# Patient Record
Sex: Male | Born: 1980 | Race: White | Hispanic: No | Marital: Married | Smoking: Never smoker
Health system: Southern US, Community
[De-identification: ages and names within clinical notes are randomized; demographics above are authoritative.]

## PROBLEM LIST (undated history)

## (undated) DIAGNOSIS — I712 Thoracic aortic aneurysm, without rupture, unspecified: Secondary | ICD-10-CM

## (undated) DIAGNOSIS — M109 Gout, unspecified: Secondary | ICD-10-CM

## (undated) DIAGNOSIS — R011 Cardiac murmur, unspecified: Secondary | ICD-10-CM

## (undated) DIAGNOSIS — Q2112 Patent foramen ovale: Secondary | ICD-10-CM

## (undated) DIAGNOSIS — I639 Cerebral infarction, unspecified: Secondary | ICD-10-CM

## (undated) HISTORY — DX: Cerebral infarction, unspecified: I63.9

## (undated) HISTORY — DX: Cardiac murmur, unspecified: R01.1

## (undated) HISTORY — DX: Patent foramen ovale: Q21.12

## (undated) HISTORY — DX: Gout, unspecified: M10.9

## (undated) HISTORY — DX: Thoracic aortic aneurysm, without rupture, unspecified: I71.20

---

## 2009-11-19 ENCOUNTER — Ambulatory Visit (HOSPITAL_COMMUNITY): Admission: RE | Admit: 2009-11-19 | Discharge: 2009-11-19 | Payer: Self-pay | Admitting: Orthopedic Surgery

## 2018-10-26 ENCOUNTER — Telehealth: Payer: Self-pay

## 2018-10-26 NOTE — Telephone Encounter (Signed)
Sent referral to scheduling and filed notes 

## 2018-10-31 NOTE — Progress Notes (Signed)
Chief Complaint  Patient presents with  . New Patient (Initial Visit)    murmur   History of Present Illness: 37 yo male with history of a cardiac murmur and possible history of bicuspid aortic valve as well as gout who is here today as a new consult, referred by Syble Creek, NP for the evaluation of his murmur. He was told as a child that he had a heart murmur. His mother recalls someone saying he may have a bicuspid aortic valve. He had testing as a kid but has not seen cardiology in the last 25 years. He feels great. No chest pain, dyspnea, near syncope, syncope or lower extremity edema. He is very active. He and his wife had their first child 6 weeks ago. He has rare palpitations that last for a few seconds.   Primary Care Physician: Pc, [facility] Syble Creek, NP)  Past Medical History:  Diagnosis Date  . Cardiac murmur, unspecified   . Gout    Past Surgical History:  No surgeries  Current Outpatient Medications  Medication Sig Dispense Refill  . allopurinol (ZYLOPRIM) 300 MG tablet Take 300 mg by mouth daily.    [name] azithromycin (ZITHROMAX) 250 MG tablet Take 2 tablets today and 1 tablet daily for 4 more days     No current facility-administered medications for this visit.     Allergies  Allergen Reactions  . Penicillins     Social History   Socioeconomic History  . Marital status: Married    Spouse name: Not on file  . Number of children: 1  . Years of education: Not on file  . Highest education level: Not on file  Occupational History  . Occupation: manages a Chief Technology Officer  Social Needs  . Financial resource strain: Not on file  . Food insecurity:    Worry: Not on file    Inability: Not on file  . Transportation needs:    Medical: Not on file    Non-medical: Not on file  Tobacco Use  . Smoking status: Never Smoker  Substance and Sexual Activity  . Alcohol use: Yes    Comment: social  . Drug use: Never  . Sexual activity: Not  on file  Lifestyle  . Physical activity:    Days per week: Not on file    Minutes per session: Not on file  . Stress: Not on file  Relationships  . Social connections:    Talks on phone: Not on file    Gets together: Not on file    Attends religious service: Not on file    Active member of club or organization: Not on file    Attends meetings of clubs or organizations: Not on file    Relationship status: Not on file  . Intimate partner violence:    Fear of current or ex partner: Not on file    Emotionally abused: Not on file    Physically abused: Not on file    Forced sexual activity: Not on file  Other Topics Concern  . Not on file  Social History Narrative  . Not on file    Family History  Problem Relation Age of Onset  . Heart murmur Mother   . Benign prostatic hyperplasia Father   . Other Father        knee replacement  . CAD Maternal Grandfather   . Atrial fibrillation Maternal Grandfather     Review of Systems:  As stated in the HPI  and otherwise negative.   BP 124/72   Pulse (!) 52   Ht 6\' 1"  (1.854 m)   Wt 263 lb 6.4 oz (119.5 kg)   SpO2 99%   BMI 34.75 kg/m   Physical Examination: General: Well developed, well nourished, NAD  HEENT: OP clear, mucus membranes moist  SKIN: warm, dry. No rashes. Neuro: No focal deficits  Musculoskeletal: Muscle strength 5/5 all ext  Psychiatric: Mood and affect normal  Neck: No JVD, no carotid bruits, no thyromegaly, no lymphadenopathy.  Lungs:Clear bilaterally, no wheezes, rhonci, crackles Cardiovascular: Regular rate and rhythm. Very soft systolic murmur.  Abdomen:Soft. Bowel sounds present. Non-tender.  Extremities: No lower extremity edema. Pulses are 2 + in the bilateral DP/PT.  EKG:  EKG is ordered today. The ekg ordered today demonstrates Sinus brady, rate 51 bpm.   Recent Labs: No results found for requested labs within last 8760 hours.   Lipid Panel No results found for: CHOL, TRIG, HDL, CHOLHDL, VLDL,  LDLCALC, LDLDIRECT   Wt Readings from Last 3 Encounters:  11/01/18 263 lb 6.4 oz (119.5 kg)     Other studies Reviewed: Additional studies/ records that were reviewed today include:  Review of the above records demonstrates:    Assessment and Plan:   1. Cardiac murmur: He has a very soft systolic murmur. He has been told in the past that he may have a bicuspid aortic valve. He has no dyspnea or chest pain. Will arrange echo to assess his valves.   Current medicines are reviewed at length with the patient today.  The patient does not have concerns regarding medicines.  The following changes have been made:  no change  Labs/ tests ordered today include:   Orders Placed This Encounter  Procedures  . EKG 12-Lead  . ECHOCARDIOGRAM COMPLETE     Disposition:   FU with me in 12 months   Signed, Verne Carrowhristopher Yailin Biederman, MD 11/01/2018 12:43 PM    [facility] [address], [place], [place]  [id number] Phone: [phone]; Fax: [phone]

## 2018-11-01 ENCOUNTER — Encounter (INDEPENDENT_AMBULATORY_CARE_PROVIDER_SITE_OTHER): Payer: Self-pay

## 2018-11-01 ENCOUNTER — Encounter: Payer: Self-pay | Admitting: Cardiovascular Disease

## 2018-11-01 ENCOUNTER — Ambulatory Visit (INDEPENDENT_AMBULATORY_CARE_PROVIDER_SITE_OTHER): Payer: 59 | Admitting: Cardiovascular Disease

## 2018-11-01 VITALS — BP 124/72 | HR 52 | Ht 73.0 in | Wt 263.4 lb

## 2018-11-01 DIAGNOSIS — R011 Cardiac murmur, unspecified: Secondary | ICD-10-CM

## 2018-11-01 NOTE — Patient Instructions (Signed)
Medication Instructions:  Your physician recommends that you continue on your current medications as directed. Please refer to the Current Medication list given to you today.  If you need a refill on your cardiac medications before your next appointment, please call your pharmacy.   Lab work: none If you have labs (blood work) drawn today and your tests are completely normal, you will receive your results only by: . MyChart Message (if you have MyChart) OR . A paper copy in the mail If you have any lab test that is abnormal or we need to change your treatment, we will call you to review the results.  Testing/Procedures: Your physician has requested that you have an echocardiogram. Echocardiography is a painless test that uses sound waves to create images of your heart. It provides your doctor with information about the size and shape of your heart and how well your heart's chambers and valves are working. This procedure takes approximately one hour. There are no restrictions for this procedure.    Follow-Up: At [facility], you and your health needs are our priority.  As part of our continuing mission to provide you with exceptional heart care, we have created designated Provider Care Teams.  These Care Teams include your primary Cardiologist (physician) and Advanced Practice Providers (APPs -  Physician Assistants and Nurse Practitioners) who all work together to provide you with the care you need, when you need it. You will need a follow up appointment in 12 months.  Please call our office 2 months in advance to schedule this appointment.  You may see Dr. McAlhany  or one of the following Advanced Practice Providers on your designated Care Team:   Brittainy Simmons, PA-C Dayna Dunn, PA-C . Michele Lenze, PA-C  Any Other Special Instructions Will Be Listed Below (If Applicable).     

## 2018-11-13 ENCOUNTER — Other Ambulatory Visit (HOSPITAL_COMMUNITY): Payer: 59

## 2018-11-16 ENCOUNTER — Ambulatory Visit (HOSPITAL_COMMUNITY): Payer: 59 | Attending: Cardiovascular Disease

## 2018-11-16 ENCOUNTER — Other Ambulatory Visit: Payer: Self-pay

## 2018-11-16 ENCOUNTER — Telehealth: Payer: Self-pay | Admitting: *Deleted

## 2018-11-16 DIAGNOSIS — R011 Cardiac murmur, unspecified: Secondary | ICD-10-CM | POA: Diagnosis present

## 2018-11-16 DIAGNOSIS — I77810 Thoracic aortic ectasia: Secondary | ICD-10-CM

## 2018-11-16 NOTE — Telephone Encounter (Signed)
-----   Message from [name], MD sent at 11/16/2018  1:43 PM EST ----- No valvular disease noted to explain a murmur. No obvious holes in his heart. His heart is strong. The aorta is mildly dilated. He will need a chest CTA to better evaluate his aortic root/aorta. Adam Mayer

## 2018-11-16 NOTE — Telephone Encounter (Signed)
Pt notified. He would like to proceed with chest CTA.

## 2018-12-12 ENCOUNTER — Ambulatory Visit (INDEPENDENT_AMBULATORY_CARE_PROVIDER_SITE_OTHER)
Admission: RE | Admit: 2018-12-12 | Discharge: 2018-12-12 | Disposition: A | Discharge status: Home / Self Care | Payer: 59 | Source: Ambulatory Visit | Attending: Cardiovascular Disease | Admitting: Cardiovascular Disease

## 2018-12-12 DIAGNOSIS — I77810 Thoracic aortic ectasia: Secondary | ICD-10-CM

## 2018-12-12 MED ORDER — IOPAMIDOL (ISOVUE-370) INJECTION 76%
100.0000 mL | Freq: Once | INTRAVENOUS | Status: AC | PRN
  Administered 2018-12-12: 100 mL via INTRAVENOUS

## 2018-12-14 ENCOUNTER — Telehealth: Payer: Self-pay | Admitting: *Deleted

## 2018-12-14 DIAGNOSIS — I712 Thoracic aortic aneurysm, without rupture, unspecified: Secondary | ICD-10-CM

## 2018-12-14 NOTE — Telephone Encounter (Signed)
I spoke with pt and reviewed results with him 

## 2018-12-14 NOTE — Telephone Encounter (Signed)
-----   Message from Kathleene Hazel, MD sent at 12/13/2018 11:21 AM EST ----- Very subtle dilation of the ascending aorta. Something we can follow. Will need repeat chest CTA in one year. cdm

## 2019-01-02 ENCOUNTER — Telehealth: Payer: Self-pay | Admitting: Cardiovascular Disease

## 2019-01-02 NOTE — Telephone Encounter (Signed)
  1. What dental office are you calling from? Dr Caryl Never  2. What is your office phone number? [phone]  3. What is your fax number? [phone]  4. What type of procedure is the patient having performed? Routine cleaning  5. What date is procedure scheduled or is the patient there now? TBD (if the patient is at the dentist's office question goes to their cardiologist if he/she is in the office.  If not, question should go to the DOD).   6. What is your question (ex. Antibiotics prior to procedure, holding medication-we need to know how long dentist wants pt to hold med)? Dentist would like to know if patient would need an antibiotic prior to cleaning

## 2019-01-02 NOTE — Telephone Encounter (Signed)
   Primary Cardiologist: Verne Carrow, MD  Chart reviewed as part of pre-operative protocol coverage. Simple dental extractions and routine cleanings are considered low risk procedures per guidelines and generally do not require any specific cardiac clearance. It is also generally accepted that for simple extractions and dental cleanings, there is no need to interrupt blood thinner therapy.   SBE prophylaxis is not required for the patient.  I will route this recommendation to the requesting party via Epic fax function and remove from pre-op pool.  Please call with questions.  Beatriz Stallion, PA-C 01/02/2019, 4:55 PM

## 2019-09-21 ENCOUNTER — Other Ambulatory Visit: Payer: Self-pay

## 2019-09-21 ENCOUNTER — Ambulatory Visit (INDEPENDENT_AMBULATORY_CARE_PROVIDER_SITE_OTHER): Payer: 59 | Admitting: Cardiology

## 2019-09-21 ENCOUNTER — Encounter: Payer: Self-pay | Admitting: Cardiology

## 2019-09-21 VITALS — BP 140/80 | HR 59 | Ht 73.0 in | Wt 265.0 lb

## 2019-09-21 DIAGNOSIS — Q211 Atrial septal defect: Secondary | ICD-10-CM

## 2019-09-21 DIAGNOSIS — I639 Cerebral infarction, unspecified: Secondary | ICD-10-CM

## 2019-09-21 DIAGNOSIS — Q2112 Patent foramen ovale: Secondary | ICD-10-CM

## 2019-09-21 DIAGNOSIS — R011 Cardiac murmur, unspecified: Secondary | ICD-10-CM

## 2019-09-21 DIAGNOSIS — I712 Thoracic aortic aneurysm, without rupture, unspecified: Secondary | ICD-10-CM

## 2019-09-21 MED ORDER — ATORVASTATIN CALCIUM 40 MG PO TABS: 40.0000 mg | ORAL_TABLET | Freq: Every day | ORAL | 3 refills | Status: DC

## 2019-09-21 NOTE — H&P (View-Only) (Signed)
Cardiology Office Note:    Date:  09/21/2019   ID:  Adam Mayer, DOB 04/15/1981, MRN [id number]  PCP:  Creig HinesPc, [facility]  Cardiologist:  Verne Carrowhristopher McAlhany, MD  Referring MD: Don BroachPc, [facility]   Chief Complaint  Patient presents with  . Hospitalization Follow-up    stroke, possible PFO    History of Present Illness:    Adam Mayer is a 38 y.o. male with a past medical history significant for cardiac murmur and possible history of bicuspid aortic valve as well as gout.  The patient was noted to have had a heart murmur since childhood.  He underwent some testing as a child but did not have cardiology follow-up until 10/2018 when he was seen by Dr. Clifton JamesMcalhany.  An echocardiogram done on 11/16/2018 showed normal LV systolic function and no valvular disease to explain his cardiac murmur.  CT angiogram was done on 12/12/2018 showing mildly aneurysmal ascending aorta up to 4.0 cm.  Annual imaging with CTA or MRA is recommended.  Mr. Adam Mayer is here today for hospital follow-up.  [facility] with complaints of left arm and leg numbness.  A code stroke was called.  CT scan of the head did not show any acute changes however MRI of the brain showed small acute infarction in the right corona radiata and small old infarctions in the left caudate nucleus, old lacunar infarction in the left putamen, small old infarction in his left cerebellum.  An echocardiogram showed normal EF 55-60% and did show positive bubble study indicating possible small PFO.  Aortic root was mildly dilated around 4 cm.  Dopplers of the lower extremities were negative for DVT.  Patient was started on aspirin and statin.  Inpatient cardiology recommended cardiac event monitor to rule out any episodes of A. fib and recommended that patient would also benefit from TEE to further assess PFO.  5 Leiden was checked and was negative.  Sed rate was normal.  Antithrombin III  was normal.  Factor II activity was normal.  Homocysteine level was in normal range.  Vitamin B12 was in normal range.  A1c was 5.0.  Lipids: TC 147, triglycerides 136, HDL 41, LDL 79 EKG with sinus bradycardia, 57 bpm  Mr. Adam Mayer is here today alone for hospital follow up. He says that he has a family history of afib in some cousins, not in his parents. His grandfather had CABG and pacemaker, possibly afib. He is feeling well. He was initially tired after the hospital stay and felt like his left side "had run 10 miles", but has felt well yesterday and today. No lightheadedness, dizziness, or loss of balance. He denies palpitations recently. He has had some quick flutters in the past that cause his to catch his breath, nothing sustained. He denies orthopnea, PND or edema.   He admits to snoring sometimes worse than others. He says it it better since he lost about 40 pounds.    Cardiac studies   Echocardiogram 09/12/2019 Left Ventricle: Systolic function is normal with an ejection fraction  of 55-65%. . Left Ventricle: Wall motion is within normal limits. . Left Atrium: Left atrium cavity size is normal. POSITIVE BUBBLE STUDY  indicating possible small PFO. Adam Mayer. Aorta: The aortic root is mildly dilated. Aortic root measures around  4.0 cm.   MRI of the head 09/12/2019 IMPRESSION:   1. Small (approximately 13 mm craniocaudal) acute infarction in the right perirolandic corona radiata. 2.  Old small infarction in the anterior body of the left caudate nucleus. 3. Old small lacunar infarction in the left putamen. 4. Very small old infarctions in the left cerebellar hemisphere. 5. The left maxillary sinus is completely filled by a combination of mucosal thickening and secretions.  MRA of the head/neck 09/12/2019 FINDINGS:   Time-of-flight MRA: TOF MRA demonstrates antegrade flow within the carotid and vertebral arteries bilaterally.  Aortic arch: No evidence of aneurysm. No significant  stenosis of the supra-aortic artery origins.  Right carotid system: No significant stenosis.  Left carotid system: No significant stenosis.  Vertebrobasilar system: No significant stenosis. The left vertebral artery is dominant.  No evidence of cervical arterial dissection. IMPRESSION: Normal MRA of the neck without and with contrast.  Cardiac CTA 12/12/2018 IMPRESSION: 1. Aortic aneurysm NOS (ICD10-I71.9). Ascending thoracic aorta measures up to 4.0 cm. Recommend annual imaging followup by CTA or MRA. This recommendation follows 2010 ACCF/AHA/AATS/ACR/ASA/SCA/SCAI/SIR/STS/SVM Guidelines for the Diagnosis and Management of Patients with Thoracic Aortic Disease. Circulation. 2010; 121: Z610-R604 2. Tiny hazy nodular densities in the medial right lower lobe are nonspecific but favor an infectious or postinflammatory process in this area. Consider first follow-up of the aortic aneurysm to be a CTA to evaluate these small densities in the right lower lobe. Otherwise, favor cross-sectional imaging follow-up with MRI due to patient's age.   Echocardiogram 11/16/2018 Study Conclusions  - Left ventricle: The cavity size was normal. Wall thickness was   normal. Systolic function was normal. The estimated ejection   fraction was in the range of 60% to 65%. Wall motion was normal;   there were no regional wall motion abnormalities. Features are   consistent with a pseudonormal left ventricular filling pattern,   with concomitant abnormal relaxation and increased filling   pressure (grade 2 diastolic dysfunction).  Past Medical History:  Diagnosis Date  . Cardiac murmur, unspecified   . Gout     No past surgical history on file.  Current Medications: Current Meds  Medication Sig  . allopurinol (ZYLOPRIM) 300 MG tablet Take 300 mg by mouth daily.  Adam Mayer aspirin 325 MG tablet Take 325 mg by mouth daily.  Adam Mayer atorvastatin (LIPITOR) 40 MG tablet Take 1 tablet (40 mg total) by mouth at  bedtime.  . [DISCONTINUED] atorvastatin (LIPITOR) 40 MG tablet Take 40 mg by mouth at bedtime.     Allergies:   Penicillins   Social History   Socioeconomic History  . Marital status: Married    Spouse name: Not on file  . Number of children: 1  . Years of education: Not on file  . Highest education level: Not on file  Occupational History  . Occupation: manages a Chief Technology Officer  Social Needs  . Financial resource strain: Not on file  . Food insecurity    Worry: Not on file    Inability: Not on file  . Transportation needs    Medical: Not on file    Non-medical: Not on file  Tobacco Use  . Smoking status: Never Smoker  . Smokeless tobacco: Never Used  Substance and Sexual Activity  . Alcohol use: Yes    Comment: social  . Drug use: Never  . Sexual activity: Not on file  Lifestyle  . Physical activity    Days per week: Not on file    Minutes per session: Not on file  . Stress: Not on file  Relationships  . Social connections    Talks on phone: Not on file  Gets together: Not on file    Attends religious service: Not on file    Active member of club or organization: Not on file    Attends meetings of clubs or organizations: Not on file    Relationship status: Not on file  Other Topics Concern  . Not on file  Social History Narrative  . Not on file     Family History: The patient's family history includes Atrial fibrillation in his maternal grandfather; Benign prostatic hyperplasia in his father; CAD in his maternal grandfather; Heart murmur in his mother; Other in his father. ROS:   Please see the history of present illness.     All other systems reviewed and are negative.   EKG:  EKG is ordered today.  The ekg ordered today demonstrates sinus bradycardia, 59 bpm, PAC, QTC 443  Recent Labs: No results found for requested labs within last 8760 hours.   Recent Lipid Panel No results found for: CHOL, TRIG, HDL, CHOLHDL, VLDL, LDLCALC, LDLDIRECT   Physical Exam:    VS:  BP 140/80   Pulse (!) 59   Ht  (1.854 m)   Wt 265 lb (120.2 kg)   SpO2 98%   BMI 34.96 kg/m     Wt Readings from Last 6 Encounters:  09/21/19 265 lb (120.2 kg)  11/01/18 263 lb 6.4 oz (119.5 kg)     Physical Exam  Constitutional: He is oriented to person, place, and time. He appears well-developed and well-nourished. No distress.  HENT:  Head: Normocephalic and atraumatic.  Neck: Normal range of motion. Neck supple. No JVD present.  Cardiovascular: Normal rate, regular rhythm and intact distal pulses.  Murmur heard. 1/6 faint murmur at right upper sternal border  Pulmonary/Chest: Effort normal and breath sounds normal. No respiratory distress. He has no wheezes. He has no rales.  Abdominal: Soft. Bowel sounds are normal.  Musculoskeletal: Normal range of motion.        General: No edema.  Neurological: He is alert and oriented to person, place, and time.  Skin: Skin is warm and dry.  Psychiatric: He has a normal mood and affect. His behavior is normal. Judgment and thought content normal.  Vitals reviewed.   ASSESSMENT:    1. Cerebrovascular accident (CVA), unspecified mechanism (HCC)   2. PFO (patent foramen ovale)   3. Thoracic aortic aneurysm without rupture (HCC)   4. Cardiac murmur   5. 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  MRI of the brain showed small acute infarction in the right corona radiata and small old infarctions in the left caudate nucleus, old lacunar infarction in the left putamen, small old infarction in his left cerebellum.   -Patient started on aspirin 325 mg and statin, Lipitor 40 mg daily. -MRA showed no significant stenosis of the carotid arteries -Seen by cardiology at [facility] and recommended event monitor to assess for occult atrial fibrillation and possible TEE to evaluate for PFO.  Bubble  study in the hospital indicated possible small PFO.  I will order both a 30-day event monitor and TEE.  Once TEE resulted, I will review with Dr. Excell Seltzer. -Patient to follow-up with neurology.  I will place a referral to a neurologist in [place] at the patient's request.  Possible PFO -Echo on 09/12/2019 during hospitalization for stroke showed positive bubble study indicating possible small PFO.  As above, will arrange for TEE for further evaluation and possible referral to Dr. Burt Knack if there is a need for repair.  Dilated ascending aorta -Subtle dilatation of the ascending aorta on CTA in 12/2018, up to 4.0 cm. -Recommendation for annual CTA.  Cardiac murmur -Patient thought he was told by his mother that he may have a bicuspid aortic valve however echocardiogram in 12/2017 showed normal aortic valve structure -Recent echocardiogram on 09/12/2019 at [place] showed normal EF 55-65%.  Medication Adjustments/Labs and Tests Ordered: Current medicines are reviewed at length with the patient today.  Concerns regarding medicines are outlined above. Labs and tests ordered and medication changes are outlined in the patient instructions below:  Patient Instructions  Medication Instructions:  Your physician recommends that you continue on your current medications as directed. Please refer to the Current Medication list given to you today.  *If you need a refill on your cardiac medications before your next appointment, please call your pharmacy*  Lab Work: TODAY: BMET & CBC   If you have labs (blood work) drawn today and your tests are completely normal, you will receive your results only by: Adam Mayer MyChart Message (if you have MyChart) OR . A paper copy in the mail If you have any lab test that is abnormal or we need to change your treatment, we will call you to review the results.  Testing/Procedures: Your physician has requested that you have a TEE.   Your Pre-procedure COVID-19 Testing  will be done on 09/26/2019 at [place], After your swab you will be given a mask to wear and instructed to go home and quarantine you are not allowed to have visitors until after your procedure. If you test positive you will be notified and your procedure will be cancelled.     A referral has been placed for you to see [facility]. Someone will contact you with an appointment    Your physician has recommended that you wear an event monitor. Event monitors are medical devices that record the heart's electrical activity. Doctors most often [place] these monitors to diagnose arrhythmias. Arrhythmias are problems with the speed or rhythm of the heartbeat. The monitor is a small, portable device. You can wear one while you do your normal daily activities. This is usually used to diagnose what is causing palpitations/syncope (passing out).     Follow-Up: At [facility], you and your health needs are our priority.  As part of our continuing mission to provide you with exceptional heart care, we have created designated Provider Care Teams.  These Care Teams include your primary Cardiologist (physician) and Advanced Practice Providers (APPs -  Physician Assistants and Nurse Practitioners) who all work together to provide you with the care you need, when you need it.  Your next appointment: We will call you with a follow up appointment once we get the results for your TEE.   Other Instructions  You are scheduled for a TEE with Dr. Harrell Gave.  Please arrive at the [facility] (Main Entrance A) at [facility]: [address] [place], [place] [address] at 7:30 am am (1 hour prior to procedure unless lab work is needed; if lab work is needed arrive 1.5 hours ahead)  DIET: Nothing to eat or drink after midnight except a sip of water with medications (see medication instructions below)     You must have a responsible person to drive you home and stay in the waiting  area  during your procedure. Failure to do so could result in cancellation.  Bring your insurance cards.  *Special Note: Every effort is made to have your procedure done on time. Occasionally there are emergencies that occur at the hospital that may cause delays. Please be patient if a delay does occur.      Signed, Berton Bon, NP  09/21/2019 6:21 PM    [facility]

## 2019-09-21 NOTE — Progress Notes (Signed)
Cardiology Office Note:    Date:  09/21/2019   ID:  Adam Mayer, DOB 04/15/1981, MRN [id number]  PCP:  [facility]ig HinesPc, [facility]  Cardiologist:  Verne Carrowhristopher McAlhany, MD  Referring MD: Don BroachPc, [facility]   Chief Complaint  Patient presents with  . Hospitalization Follow-up    stroke, possible PFO    History of Present Illness:    Adam Mayer is a 38 y.o. male with a past medical history significant for cardiac murmur and possible history of bicuspid aortic valve as well as gout.  The patient was noted to have had a heart murmur since childhood.  He underwent some testing as a child but did not have cardiology follow-up until 10/2018 when he was seen by Dr. Clifton JamesMcalhany.  An echocardiogram done on 11/16/2018 showed normal LV systolic function and no valvular disease to explain his cardiac murmur.  CT angiogram was done on 12/12/2018 showing mildly aneurysmal ascending aorta up to 4.0 cm.  Annual imaging with CTA or MRA is recommended.  Mr. Adam Mayer is here today for hospital follow-up.  [facility] with complaints of left arm and leg numbness.  A code stroke was called.  CT scan of the head did not show any acute changes however MRI of the brain showed small acute infarction in the right corona radiata and small old infarctions in the left caudate nucleus, old lacunar infarction in the left putamen, small old infarction in his left cerebellum.  An echocardiogram showed normal EF 55-60% and did show positive bubble study indicating possible small PFO.  Aortic root was mildly dilated around 4 cm.  Dopplers of the lower extremities were negative for DVT.  Patient was started on aspirin and statin.  Inpatient cardiology recommended cardiac event monitor to rule out any episodes of A. fib and recommended that patient would also benefit from TEE to further assess PFO.  5 Leiden was checked and was negative.  Sed rate was normal.  Antithrombin III  was normal.  Factor II activity was normal.  Homocysteine level was in normal range.  Vitamin B12 was in normal range.  A1c was 5.0.  Lipids: TC 147, triglycerides 136, HDL 41, LDL 79 EKG with sinus bradycardia, 57 bpm  Mr. Adam Mayer is here today alone for hospital follow up. He says that he has a family history of afib in some cousins, not in his parents. His grandfather had CABG and pacemaker, possibly afib. He is feeling well. He was initially tired after the hospital stay and felt like his left side "had run 10 miles", but has felt well yesterday and today. No lightheadedness, dizziness, or loss of balance. He denies palpitations recently. He has had some quick flutters in the past that cause his to catch his breath, nothing sustained. He denies orthopnea, PND or edema.   He admits to snoring sometimes worse than others. He says it it better since he lost about 40 pounds.    Cardiac studies   Echocardiogram 09/12/2019 Left Ventricle: Systolic function is normal with an ejection fraction  of 55-65%. . Left Ventricle: Wall motion is within normal limits. . Left Atrium: Left atrium cavity size is normal. POSITIVE BUBBLE STUDY  indicating possible small PFO. Adam Mayer. Aorta: The aortic root is mildly dilated. Aortic root measures around  4.0 cm.   MRI of the head 09/12/2019 IMPRESSION:   1. Small (approximately 13 mm craniocaudal) acute infarction in the right perirolandic corona radiata. 2.  Old small infarction in the anterior body of the left caudate nucleus. 3. Old small lacunar infarction in the left putamen. 4. Very small old infarctions in the left cerebellar hemisphere. 5. The left maxillary sinus is completely filled by a combination of mucosal thickening and secretions.  MRA of the head/neck 09/12/2019 FINDINGS:   Time-of-flight MRA: TOF MRA demonstrates antegrade flow within the carotid and vertebral arteries bilaterally.  Aortic arch: No evidence of aneurysm. No significant  stenosis of the supra-aortic artery origins.  Right carotid system: No significant stenosis.  Left carotid system: No significant stenosis.  Vertebrobasilar system: No significant stenosis. The left vertebral artery is dominant.  No evidence of cervical arterial dissection. IMPRESSION: Normal MRA of the neck without and with contrast.  Cardiac CTA 12/12/2018 IMPRESSION: 1. Aortic aneurysm NOS (ICD10-I71.9). Ascending thoracic aorta measures up to 4.0 cm. Recommend annual imaging followup by CTA or MRA. This recommendation follows 2010 ACCF/AHA/AATS/ACR/ASA/SCA/SCAI/SIR/STS/SVM Guidelines for the Diagnosis and Management of Patients with Thoracic Aortic Disease. Circulation. 2010; 121: Z610-R604 2. Tiny hazy nodular densities in the medial right lower lobe are nonspecific but favor an infectious or postinflammatory process in this area. Consider first follow-up of the aortic aneurysm to be a CTA to evaluate these small densities in the right lower lobe. Otherwise, favor cross-sectional imaging follow-up with MRI due to patient's age.   Echocardiogram 11/16/2018 Study Conclusions  - Left ventricle: The cavity size was normal. Wall thickness was   normal. Systolic function was normal. The estimated ejection   fraction was in the range of 60% to 65%. Wall motion was normal;   there were no regional wall motion abnormalities. Features are   consistent with a pseudonormal left ventricular filling pattern,   with concomitant abnormal relaxation and increased filling   pressure (grade 2 diastolic dysfunction).  Past Medical History:  Diagnosis Date  . Cardiac murmur, unspecified   . Gout     No past surgical history on file.  Current Medications: Current Meds  Medication Sig  . allopurinol (ZYLOPRIM) 300 MG tablet Take 300 mg by mouth daily.  Adam Mayer aspirin 325 MG tablet Take 325 mg by mouth daily.  Adam Mayer atorvastatin (LIPITOR) 40 MG tablet Take 1 tablet (40 mg total) by mouth at  bedtime.  . [DISCONTINUED] atorvastatin (LIPITOR) 40 MG tablet Take 40 mg by mouth at bedtime.     Allergies:   Penicillins   Social History   Socioeconomic History  . Marital status: Married    Spouse name: Not on file  . Number of children: 1  . Years of education: Not on file  . Highest education level: Not on file  Occupational History  . Occupation: manages a Chief Technology Officer  Social Needs  . Financial resource strain: Not on file  . Food insecurity    Worry: Not on file    Inability: Not on file  . Transportation needs    Medical: Not on file    Non-medical: Not on file  Tobacco Use  . Smoking status: Never Smoker  . Smokeless tobacco: Never Used  Substance and Sexual Activity  . Alcohol use: Yes    Comment: social  . Drug use: Never  . Sexual activity: Not on file  Lifestyle  . Physical activity    Days per week: Not on file    Minutes per session: Not on file  . Stress: Not on file  Relationships  . Social connections    Talks on phone: Not on file  Gets together: Not on file    Attends religious service: Not on file    Active member of club or organization: Not on file    Attends meetings of clubs or organizations: Not on file    Relationship status: Not on file  Other Topics Concern  . Not on file  Social History Narrative  . Not on file     Family History: The patient's family history includes Atrial fibrillation in his maternal grandfather; Benign prostatic hyperplasia in his father; CAD in his maternal grandfather; Heart murmur in his mother; Other in his father. ROS:   Please see the history of present illness.     All other systems reviewed and are negative.   EKG:  EKG is ordered today.  The ekg ordered today demonstrates sinus bradycardia, 59 bpm, PAC, QTC 443  Recent Labs: No results found for requested labs within last 8760 hours.   Recent Lipid Panel No results found for: CHOL, TRIG, HDL, CHOLHDL, VLDL, LDLCALC, LDLDIRECT   Physical Exam:    VS:  BP 140/80   Pulse (!) 59   Ht 6' 1" (1.854 m)   Wt 265 lb (120.2 kg)   SpO2 98%   BMI 34.96 kg/m     Wt Readings from Last 6 Encounters:  09/21/19 265 lb (120.2 kg)  11/01/18 263 lb 6.4 oz (119.5 kg)     Physical Exam  Constitutional: He is oriented to person, place, and time. He appears well-developed and well-nourished. No distress.  HENT:  Head: Normocephalic and atraumatic.  Neck: Normal range of motion. Neck supple. No JVD present.  Cardiovascular: Normal rate, regular rhythm and intact distal pulses.  Murmur heard. 1/6 faint murmur at right upper sternal border  Pulmonary/Chest: Effort normal and breath sounds normal. No respiratory distress. He has no wheezes. He has no rales.  Abdominal: Soft. Bowel sounds are normal.  Musculoskeletal: Normal range of motion.        General: No edema.  Neurological: He is alert and oriented to person, place, and time.  Skin: Skin is warm and dry.  Psychiatric: He has a normal mood and affect. His behavior is normal. Judgment and thought content normal.  Vitals reviewed.   ASSESSMENT:    1. Cerebrovascular accident (CVA), unspecified mechanism (HCC)   2. PFO (patent foramen ovale)   3. Thoracic aortic aneurysm without rupture (HCC)   4. Cardiac murmur   5. 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  MRI of the brain showed small acute infarction in the right corona radiata and small old infarctions in the left caudate nucleus, old lacunar infarction in the left putamen, small old infarction in his left cerebellum.   -Patient started on aspirin 325 mg and statin, Lipitor 40 mg daily. -MRA showed no significant stenosis of the carotid arteries -Seen by cardiology at [facility] and recommended event monitor to assess for occult atrial fibrillation and possible TEE to evaluate for PFO.  Bubble  study in the hospital indicated possible small PFO.  I will order both a 30-day event monitor and TEE.  Once TEE resulted, I will review with Dr. Cooper. -Patient to follow-up with neurology.  I will place a referral to a neurologist in [place] at the patient's request.  Possible PFO -Echo on 09/12/2019 during hospitalization for stroke showed positive bubble study indicating possible small PFO.    As above, will arrange for TEE for further evaluation and possible referral to Dr. Burt Knack if there is a need for repair.  Dilated ascending aorta -Subtle dilatation of the ascending aorta on CTA in 12/2018, up to 4.0 cm. -Recommendation for annual CTA.  Cardiac murmur -Patient thought he was told by his mother that he may have a bicuspid aortic valve however echocardiogram in 12/2017 showed normal aortic valve structure -Recent echocardiogram on 09/12/2019 at [place] showed normal EF 55-65%.  Medication Adjustments/Labs and Tests Ordered: Current medicines are reviewed at length with the patient today.  Concerns regarding medicines are outlined above. Labs and tests ordered and medication changes are outlined in the patient instructions below:  Patient Instructions  Medication Instructions:  Your physician recommends that you continue on your current medications as directed. Please refer to the Current Medication list given to you today.  *If you need a refill on your cardiac medications before your next appointment, please call your pharmacy*  Lab Work: TODAY: BMET & CBC   If you have labs (blood work) drawn today and your tests are completely normal, you will receive your results only by: Adam Mayer MyChart Message (if you have MyChart) OR . A paper copy in the mail If you have any lab test that is abnormal or we need to change your treatment, we will call you to review the results.  Testing/Procedures: Your physician has requested that you have a TEE.   Your Pre-procedure COVID-19 Testing  will be done on 09/26/2019 at [place], After your swab you will be given a mask to wear and instructed to go home and quarantine you are not allowed to have visitors until after your procedure. If you test positive you will be notified and your procedure will be cancelled.     A referral has been placed for you to see [facility]. Someone will contact you with an appointment    Your physician has recommended that you wear an event monitor. Event monitors are medical devices that record the heart's electrical activity. Doctors most often [place] these monitors to diagnose arrhythmias. Arrhythmias are problems with the speed or rhythm of the heartbeat. The monitor is a small, portable device. You can wear one while you do your normal daily activities. This is usually used to diagnose what is causing palpitations/syncope (passing out).     Follow-Up: At [facility], you and your health needs are our priority.  As part of our continuing mission to provide you with exceptional heart care, we have created designated Provider Care Teams.  These Care Teams include your primary Cardiologist (physician) and Advanced Practice Providers (APPs -  Physician Assistants and Nurse Practitioners) who all work together to provide you with the care you need, when you need it.  Your next appointment: We will call you with a follow up appointment once we get the results for your TEE.   Other Instructions  You are scheduled for a TEE with Dr. Harrell Gave.  Please arrive at the [facility] (Main Entrance A) at [facility]: [address] [place], [place] [address] at 7:30 am am (1 hour prior to procedure unless lab work is needed; if lab work is needed arrive 1.5 hours ahead)  DIET: Nothing to eat or drink after midnight except a sip of water with medications (see medication instructions below)     You must have a responsible person to drive you home and stay in the waiting  area  during your procedure. Failure to do so could result in cancellation.  Bring your insurance cards.  *Special Note: Every effort is made to have your procedure done on time. Occasionally there are emergencies that occur at the hospital that may cause delays. Please be patient if a delay does occur.      Signed, Berton Bon, NP  09/21/2019 6:21 PM    [facility]

## 2019-09-21 NOTE — Patient Instructions (Addendum)
Medication Instructions:  Your physician recommends that you continue on your current medications as directed. Please refer to the Current Medication list given to you today.  *If you need a refill on your cardiac medications before your next appointment, please call your pharmacy*  Lab Work: TODAY: BMET & CBC   If you have labs (blood work) drawn today and your tests are completely normal, you will receive your results only by: [name] MyChart Message (if you have MyChart) OR . A paper copy in the mail If you have any lab test that is abnormal or we need to change your treatment, we will call you to review the results.  Testing/Procedures: Your physician has requested that you have a TEE.   Your Pre-procedure COVID-19 Testing will be done on 09/26/2019 at [place], After your swab you will be given a mask to wear and instructed to go home and quarantine you are not allowed to have visitors until after your procedure. If you test positive you will be notified and your procedure will be cancelled.     A referral has been placed for you to see [facility]. Someone will contact you with an appointment    Your physician has recommended that you wear an event monitor. Event monitors are medical devices that record the heart's electrical activity. Doctors most often [place] these monitors to diagnose arrhythmias. Arrhythmias are problems with the speed or rhythm of the heartbeat. The monitor is a small, portable device. You can wear one while you do your normal daily activities. This is usually used to diagnose what is causing palpitations/syncope (passing out).     Follow-Up: At [facility], you and your health needs are our priority.  As part of our continuing mission to provide you with exceptional heart care, we have created designated Provider Care Teams.  These Care Teams include your primary Cardiologist (physician) and Advanced Practice Providers (APPs -   Physician Assistants and Nurse Practitioners) who all work together to provide you with the care you need, when you need it.  Your next appointment: We will call you with a follow up appointment once we get the results for your TEE.   Other Instructions  You are scheduled for a TEE with Dr. Harrell Gave.  Please arrive at the [facility] (Main Entrance A) at [facility]: [address] [place], [place] [address] at 7:30 am am (1 hour prior to procedure unless lab work is needed; if lab work is needed arrive 1.5 hours ahead)  DIET: Nothing to eat or drink after midnight except a sip of water with medications (see medication instructions below)     You must have a responsible person to drive you home and stay in the waiting area during your procedure. Failure to do so could result in cancellation.  Bring your insurance cards.  *Special Note: Every effort is made to have your procedure done on time. Occasionally there are emergencies that occur at the hospital that may cause delays. Please be patient if a delay does occur.

## 2019-09-22 LAB — BASIC METABOLIC PANEL
BUN/Creatinine Ratio: 13 (ref 9–20)
BUN: 10 mg/dL (ref 6–24)
CO2: 22 mmol/L (ref 20–29)
Calcium: 9.8 mg/dL (ref 8.7–10.2)
Chloride: 104 mmol/L (ref 96–106)
Creatinine, Ser: 0.76 mg/dL (ref 0.76–1.27)
GFR calc Af Amer: 132 mL/min/{1.73_m2} (ref >59)
GFR calc non Af Amer: 114 mL/min/{1.73_m2} (ref >59)
Glucose: 87 mg/dL (ref 65–99)
Potassium: 4.2 mmol/L (ref 3.5–5.2)
Sodium: 142 mmol/L (ref 134–144)

## 2019-09-22 LAB — CBC
Hematocrit: 48 % (ref 37.5–51.0)
Hemoglobin: 16.4 g/dL (ref 13.0–17.7)
MCH: 30.7 pg (ref 26.6–33.0)
MCHC: 34.2 g/dL (ref 31.5–35.7)
MCV: 90 fL (ref 79–97)
Platelets: 274 10*3/uL (ref 150–450)
RBC: 5.35 x10E6/uL (ref 4.14–5.80)
RDW: 12.8 % (ref 11.6–15.4)
WBC: 9.3 10*3/uL (ref 3.4–10.8)

## 2019-09-26 ENCOUNTER — Other Ambulatory Visit (HOSPITAL_COMMUNITY)
Admission: RE | Admit: 2019-09-26 | Discharge: 2019-09-26 | Disposition: A | Discharge status: Home / Self Care | Payer: 59 | Source: Ambulatory Visit | Attending: Cardiology | Admitting: Cardiology

## 2019-09-26 DIAGNOSIS — Z01812 Encounter for preprocedural laboratory examination: Secondary | ICD-10-CM | POA: Diagnosis not present

## 2019-09-26 DIAGNOSIS — Z20828 Contact with and (suspected) exposure to other viral communicable diseases: Secondary | ICD-10-CM | POA: Diagnosis not present

## 2019-09-26 LAB — SARS CORONAVIRUS 2 (TAT 6-24 HRS): SARS Coronavirus 2: NEGATIVE

## 2019-09-27 ENCOUNTER — Encounter (HOSPITAL_COMMUNITY): Payer: Self-pay | Admitting: *Deleted

## 2019-09-27 ENCOUNTER — Ambulatory Visit (HOSPITAL_BASED_OUTPATIENT_CLINIC_OR_DEPARTMENT_OTHER): Payer: 59

## 2019-09-27 ENCOUNTER — Other Ambulatory Visit: Payer: Self-pay

## 2019-09-27 ENCOUNTER — Ambulatory Visit (HOSPITAL_COMMUNITY)
Admission: RE | Admit: 2019-09-27 | Discharge: 2019-09-27 | Disposition: A | Discharge status: Home / Self Care | Payer: 59 | Attending: Cardiology | Admitting: Cardiology

## 2019-09-27 ENCOUNTER — Encounter (HOSPITAL_COMMUNITY)
Admission: RE | Disposition: A | Discharge status: Home / Self Care | Payer: Self-pay | Source: Home / Self Care | Attending: Cardiology

## 2019-09-27 DIAGNOSIS — I639 Cerebral infarction, unspecified: Secondary | ICD-10-CM

## 2019-09-27 DIAGNOSIS — Q2112 Patent foramen ovale: Secondary | ICD-10-CM

## 2019-09-27 DIAGNOSIS — I6340 Cerebral infarction due to embolism of unspecified cerebral artery: Secondary | ICD-10-CM

## 2019-09-27 DIAGNOSIS — I080 Rheumatic disorders of both mitral and aortic valves: Secondary | ICD-10-CM | POA: Insufficient documentation

## 2019-09-27 DIAGNOSIS — Q211 Atrial septal defect: Secondary | ICD-10-CM | POA: Insufficient documentation

## 2019-09-27 DIAGNOSIS — I712 Thoracic aortic aneurysm, without rupture: Secondary | ICD-10-CM | POA: Insufficient documentation

## 2019-09-27 DIAGNOSIS — I69359 Hemiplegia and hemiparesis following cerebral infarction affecting unspecified side: Secondary | ICD-10-CM | POA: Insufficient documentation

## 2019-09-27 DIAGNOSIS — R0683 Snoring: Secondary | ICD-10-CM | POA: Insufficient documentation

## 2019-09-27 DIAGNOSIS — Z8249 Family history of ischemic heart disease and other diseases of the circulatory system: Secondary | ICD-10-CM | POA: Insufficient documentation

## 2019-09-27 DIAGNOSIS — Z79899 Other long term (current) drug therapy: Secondary | ICD-10-CM | POA: Diagnosis not present

## 2019-09-27 DIAGNOSIS — R011 Cardiac murmur, unspecified: Secondary | ICD-10-CM | POA: Insufficient documentation

## 2019-09-27 DIAGNOSIS — Z880 Allergy status to penicillin: Secondary | ICD-10-CM | POA: Diagnosis not present

## 2019-09-27 DIAGNOSIS — Z7982 Long term (current) use of aspirin: Secondary | ICD-10-CM | POA: Insufficient documentation

## 2019-09-27 HISTORY — PX: TEE WITHOUT CARDIOVERSION: SHX5443

## 2019-09-27 HISTORY — PX: BUBBLE STUDY: SHX6837

## 2019-09-27 SURGERY — ECHOCARDIOGRAM, TRANSESOPHAGEAL
Anesthesia: Moderate Sedation

## 2019-09-27 MED ORDER — MIDAZOLAM HCL (PF) 5 MG/ML IJ SOLN
INTRAMUSCULAR | Status: AC
  Filled 2019-09-27: qty 3

## 2019-09-27 MED ORDER — FENTANYL CITRATE (PF) 100 MCG/2ML IJ SOLN
INTRAMUSCULAR | Status: AC
  Filled 2019-09-27: qty 4

## 2019-09-27 MED ORDER — FENTANYL CITRATE (PF) 100 MCG/2ML IJ SOLN
INTRAMUSCULAR | Status: DC | PRN
  Administered 2019-09-27 (×4): 25 ug via INTRAVENOUS

## 2019-09-27 MED ORDER — SODIUM CHLORIDE 0.9 % IV SOLN: INTRAVENOUS | Status: DC

## 2019-09-27 MED ORDER — SODIUM CHLORIDE BACTERIOSTATIC 0.9 % IJ SOLN
INTRAMUSCULAR | Status: DC | PRN
  Administered 2019-09-27: 9 mL via INTRAVENOUS

## 2019-09-27 MED ORDER — MIDAZOLAM HCL (PF) 10 MG/2ML IJ SOLN
INTRAMUSCULAR | Status: DC | PRN
  Administered 2019-09-27 (×5): 2 mg via INTRAVENOUS

## 2019-09-27 MED ORDER — SODIUM CHLORIDE 0.9 % IV SOLN
INTRAVENOUS | Status: AC | PRN
  Administered 2019-09-27: 500 mL via INTRAVENOUS

## 2019-09-27 MED ORDER — DIPHENHYDRAMINE HCL 50 MG/ML IJ SOLN
INTRAMUSCULAR | Status: AC
  Filled 2019-09-27: qty 1

## 2019-09-27 MED ORDER — LIDOCAINE VISCOUS HCL 2 % MT SOLN
OROMUCOSAL | Status: AC
  Filled 2019-09-27: qty 15

## 2019-09-27 MED ORDER — LIDOCAINE VISCOUS HCL 2 % MT SOLN
OROMUCOSAL | Status: DC | PRN
  Administered 2019-09-27: 20 mL via OROMUCOSAL

## 2019-09-27 NOTE — Discharge Instructions (Signed)

## 2019-09-27 NOTE — CV Procedure (Signed)
    TRANSESOPHAGEAL ECHOCARDIOGRAM   NAME:  Adam Mayer   MRN: [id number] DOB:  1981/09/02   ADMIT DATE: 09/27/2019  INDICATIONS: CVA  PROCEDURE:   Informed consent was obtained prior to the procedure. The risks, benefits and alternatives for the procedure were discussed and the patient comprehended these risks.  Risks include, but are not limited to, cough, sore throat, vomiting, nausea, somnolence, esophageal and stomach trauma or perforation, bleeding, low blood pressure, aspiration, pneumonia, infection, trauma to the teeth and death.    Procedural time out performed. The oropharynx was anesthetized with topical 1% lidocaine.    During this procedure the patient is administered a total of Versed 10 mg and Fentanyl 100 mcg to achieve and maintain moderate conscious sedation.  The patient's heart rate, blood pressure, and oxygen saturation are monitored continuously during the procedure. The period of conscious sedation is 22 minutes, of which I was present face-to-face 100% of this time.   The transesophageal probe was inserted in the esophagus and stomach without difficulty and multiple views were obtained.    COMPLICATIONS:    There were no immediate complications.  FINDINGS:  LEFT VENTRICLE: EF = 60-65%. No regional wall motion abnormalities.  RIGHT VENTRICLE: Normal size and function.   LEFT ATRIUM: No thrombus/mass.  LEFT ATRIAL APPENDAGE: No thrombus/mass.   RIGHT ATRIUM: No thrombus. Prominent Chiari network.  AORTIC VALVE:  Technically trileaflet, but prominent NCC with partially fused LCC-RCC. Functions as bicuspid, with trivial regurgitation at the commissure. No vegetation.  MITRAL VALVE:    Normal structure. Trivial regurgitation. No vegetation.  TRICUSPID VALVE: Normal structure. Trivial regurgitation. No vegetation.  PULMONIC VALVE: Grossly normal structure. No regurgitation. No apparent vegetation.  INTERATRIAL SEPTUM: Significant PFO seen, with  visualize color doppler left to right through the channel. Very positive bubble study for right to left shunting, with continuous bubble flow throughout the remainder of the procedure. Bubbles seen in descending aorta on probe removal as well.  PERICARDIUM: No effusion noted.  DESCENDING AORTA: No plaque seen. No dissection seen.  ASCENDING AORTA: Enlarged, measured at 4.2 cm. No dissection seen.   CONCLUSION: Significant PFO flow, with visualized left to right flow with color doppler and very impressive and persistent positive bubble study for right to left flow. Partially fused aortic valve, borderline functionally bicuspid. Enlarged ascending aorta measuring almost 4.2 cm.   Buford Dresser, MD, PhD [facility]  [address], [place], [place] [address] [phone]   8:02 AM

## 2019-09-27 NOTE — Progress Notes (Signed)
  Echocardiogram Echocardiogram Transesophageal has been performed.  Adam Mayer R 09/27/2019, 8:16 AM

## 2019-09-27 NOTE — Interval H&P Note (Signed)
History and Physical Interval Note:  09/27/2019 7:16 AM  Adam Mayer  has presented today for surgery, with the diagnosis of PFO.  The various methods of treatment have been discussed with the patient and family. After consideration of risks, benefits and other options for treatment, the patient has consented to  Procedure(s): TRANSESOPHAGEAL ECHOCARDIOGRAM (TEE) (N/A) as a surgical intervention.  The patient's history has been reviewed, patient examined, no change in status, stable for surgery.  I have reviewed the patient's chart and labs.  Questions were answered to the patient's satisfaction.     Barrie Wale Harrell Gave

## 2019-09-28 ENCOUNTER — Encounter (HOSPITAL_COMMUNITY): Payer: Self-pay | Admitting: Cardiology

## 2019-10-01 ENCOUNTER — Telehealth: Payer: Self-pay

## 2019-10-01 NOTE — Telephone Encounter (Signed)
Per Dr. Angelena Form, called to arrange PFO consult with Dr. Burt Knack.  Left message to call back.

## 2019-10-01 NOTE — Telephone Encounter (Signed)
Scheduled patient for PFO consult 10/28 with Dr. Burt Knack. Tentatively scheduled closure 10/30 pending consult. If the patient and Dr. Burt Knack decide to proceed, labs will be drawn 10/28 and COVID swab 10/29. The patient agrees with treatment plan.

## 2019-10-03 ENCOUNTER — Encounter: Payer: Self-pay | Admitting: Cardiovascular Disease

## 2019-10-03 ENCOUNTER — Other Ambulatory Visit: Payer: Self-pay

## 2019-10-03 ENCOUNTER — Ambulatory Visit (INDEPENDENT_AMBULATORY_CARE_PROVIDER_SITE_OTHER): Payer: 59 | Admitting: Cardiovascular Disease

## 2019-10-03 ENCOUNTER — Other Ambulatory Visit (HOSPITAL_COMMUNITY): Payer: 59

## 2019-10-03 VITALS — BP 116/82 | HR 52 | Ht 73.0 in | Wt 264.6 lb

## 2019-10-03 DIAGNOSIS — Q211 Atrial septal defect: Secondary | ICD-10-CM

## 2019-10-03 DIAGNOSIS — Q2112 Patent foramen ovale: Secondary | ICD-10-CM

## 2019-10-03 MED ORDER — ASPIRIN EC 81 MG PO TBEC: 81.0000 mg | DELAYED_RELEASE_TABLET | Freq: Every day | ORAL | 3 refills | Status: AC

## 2019-10-03 MED ORDER — CLOPIDOGREL BISULFATE 75 MG PO TABS: 75.0000 mg | ORAL_TABLET | Freq: Every day | ORAL | 0 refills | Status: DC

## 2019-10-03 NOTE — Progress Notes (Signed)
[facility]ENTER   STRUCTURAL HEART TEAM  Date:  10/03/2019   ID:  Adam Mayer, DOB [birth year], MRN [id number]  PCP:  Pc, [facility]   Chief Complaint  Patient presents with  . PFO     HISTORY OF PRESENT ILLNESS: Adam Mayer is a 38 y.o. male who presents for evaluation of PFO, referred by Adam Ades, NP.  The patient has previously been seen by Dr. Angelena Form for evaluation of heart murmur.  An echocardiogram last year showed normal LV systolic function and no significant valvular disease.  A CTA of the chest was done because of aortic dilatation and this demonstrated a dilated ascending aorta of 4 cm with annual follow-up recommended.  The patient was in his normal state of health until September 12, 2019 when he complained of sudden onset of left arm and leg numbness.  He was evaluated in the emergency department where a code stroke was called.  CT of the brain showed no changes of acute infarction but an MRI showed a small acute infarction in the right corona radiata and small old infarctions in the left caudate nucleus as well as an old lacunar infarction in the left putamen and an old infarction in the left cerebellum.  The patient had an updated echocardiogram with bubble study and this demonstrated the possibility of a small PFO.  Doppler studies were negative for DVT.  The patient was started on aspirin and a statin drug.  He has no residual symptoms and has recovered well.  Hypercoagulable panel was done and showed no significant abnormalities.  The patient subsequently underwent a transesophageal echo which demonstrated a functionally bicuspid aortic valve and a moderate to large PFO with left-to-right color flow as well as evidence of right to left shunting by bubble study.  The patient is here alone today.  His mother is conferenced in over the telephone.  He denies chest pain, chest pressure, shortness of breath, lightheadedness, orthopnea, or PND.    The patient denies any history of nickel allergy or reaction.  Past Medical History:  Diagnosis Date  . Cardiac murmur, unspecified   . Gout     Current Outpatient Medications  Medication Sig Dispense Refill  . allopurinol (ZYLOPRIM) 300 MG tablet Take 300 mg by mouth daily.    [name] atorvastatin (LIPITOR) 40 MG tablet Take 1 tablet (40 mg total) by mouth at bedtime. 90 tablet 3  . Multiple Vitamins-Minerals (MULTIVITAMIN WITH MINERALS) tablet Take 1 tablet by mouth daily.    . Omega-3 Fatty Acids (FISH OIL) 1000 MG CAPS Take 2,000 mg by mouth daily.    [name] aspirin EC 81 MG tablet Take 1 tablet (81 mg total) by mouth daily. 90 tablet 3  . clopidogrel (PLAVIX) 75 MG tablet Take 1 tablet (75 mg total) by mouth daily. 90 tablet 0   No current facility-administered medications for this visit.     ALLERGIES:   Penicillins   SOCIAL HISTORY:  The patient  reports that he has never smoked. He has never used smokeless tobacco. He reports current alcohol use of about 2.0 standard drinks of alcohol per week. He reports that he does not use drugs.   FAMILY HISTORY:  The patient's family history includes Atrial fibrillation in his maternal grandfather; Benign prostatic hyperplasia in his father; CAD in his maternal grandfather; Heart murmur in his mother; Other in his father.   REVIEW OF SYSTEMS:  All other systems are reviewed and negative.   PHYSICAL EXAM:  VS:  BP 116/82   Pulse (!) 52   Ht 6' 1" (1.854 m)   Wt 264 lb 9.6 oz (120 kg)   SpO2 97%   BMI 34.91 kg/m  , BMI Body mass index is 34.91 kg/m. GEN: Well nourished, well developed, in no acute distress HEENT: normal Neck: No JVD. carotids 2+ without bruits or masses Cardiac: The heart is RRR without murmurs, rubs, or gallops. No edema. Pedal pulses 2+ = bilaterally  Respiratory:  clear to auscultation bilaterally GI: soft, nontender, nondistended, + BS MS: no deformity or atrophy Skin: warm and dry, no rash Neuro:  Strength and  sensation are intact Psych: euthymic mood, full affect  RECENT LABS: 09/21/2019: BUN 10; Creatinine, Ser 0.76; Hemoglobin 16.4; Platelets 274; Potassium 4.2; Sodium 142  No results found for requested labs within last 8760 hours.   Estimated Creatinine Clearance: 166.5 mL/min (by C-G formula based on SCr of 0.76 mg/dL).   Wt Readings from Last 3 Encounters:  10/03/19 264 lb 9.6 oz (120 kg)  09/27/19 257 lb (116.6 kg)  09/21/19 265 lb (120.2 kg)     PERTINENT STUDIES: TEE: FINDINGS  Left Ventricle: Left ventricular ejection fraction, by visual estimation, is 60 to 65%. The left ventricle has normal function. No evidence of left ventricular regional wall motion abnormalities. There is no left ventricular hypertrophy. Spectral  Doppler shows Left ventricular diastolic function could not be evaluated pattern of LV diastolic filling.  Right Ventricle: The right ventricular size is normal. No increase in right ventricular wall thickness. Global RV systolic function is has normal systolic function.  Left Atrium: Left atrial size was not assessed.  Right Atrium: Right atrial size was not assessed Prominent Chiari network.  Pericardium: There is no evidence of pericardial effusion.  Mitral Valve: The mitral valve is normal in structure. Trace mitral valve regurgitation.  Tricuspid Valve: The tricuspid valve is normal in structure. Tricuspid valve regurgitation is trivial by color flow Doppler.  Aortic Valve: The aortic valve is abnormal. Aortic valve regurgitation is trivial by color flow Doppler. The aortic valve is structurally normal, with no evidence of sclerosis or stenosis. Technically trileaflet, but prominent NCC with partially fused  LCC-RCC. Functions as bicuspid, with trivial regurgitation at the commissure.  Pulmonic Valve: The pulmonic valve was grossly normal. Pulmonic valve regurgitation is not visualized by color flow Doppler.  Aorta: Aortic dilatation noted.  There is moderate dilatation of the ascending aorta measuring 42 mm.  Shunts: Agitated saline contrast was given intravenously to evaluate for intracardiac shunting. Saline contrast bubble study was positive, with evidence of interatrial shunting. A moderately sized patent foramen ovale is detected with bidirectional  shunting across atrial septum. Evidence of atrial level shunting detected by color flow Doppler.   ASSESSMENT AND PLAN: 1.  PFO -the patient presents with a recent cryptogenic stroke.  There is MRI evidence of both an acute as well as multiple prior events and no single vascular territory.  This is highly suggestive of cardioembolism.  I personally reviewed the patient's transesophageal echo images and he has a large PFO which anatomically places him at higher risk of recurrent stroke.  The patient's Rope Score is 8, indicating a high probability that the patient's stroke is 'PFO-related.'  The patient is counseled about the association of PFO and cryptogenic stroke. Available clinical trial data is reviewed, specifically those trials comparing transcatheter PFO closure and medical therapy with antiplatelet drugs. The patient understands the potential benefit of PFO closure with respect to   secondary stroke reduction compared with medical therapy alone. Specific risks of transcatheter PFO closure are reviewed with the patient. These risks include bleeding, infection, device embolization, stroke, cardiac perforation, tamponade, arrhythmia, MI, and late device erosion. He understands these serious risks occur at low incidence of < 1%.  The patient understands the need to take dual antiplatelet therapy with aspirin and clopidogrel together for a minimum period of 3 months following transcatheter PFO closure.  They understand the need to follow SBE prophylaxis per guidelines for a period of 6 months following PFO closure.  After understanding potential risks and benefits, the patient would like  to proceed with transcatheter closure.  The patient provides full informed consent for the procedure.   Tonny Bollman 10/03/2019 1:23 PM

## 2019-10-03 NOTE — Patient Instructions (Addendum)
MEDICATION INFORMATION:  1) START PLAVIX 75 mg daily. Take 150 mg (2 pills) for your first dose ONLY.  2) Decrease ASPIRIN to 81 mg daily.    COVID SCREENING INFORMATION:  You are scheduled for your COVID screening on Thursday, October 29 at [facility] (old [facility])  [address]  Stay in the RIGHT lane and tell them you are there for pre-procedure testing  Do NOT bring any pets with you to the testing site    PFO CLOSURE INSTRUCTIONS:  You are scheduled for a PFO CLOSURE on Friday, October 30 with Dr. Sherren Mocha.   1. Please arrive at the [facility] (Main Entrance A) at [facility]: [address] [place], [place] [address] at 10:00 AM (This time is two hours before your procedure to ensure your preparation). You are allowed ONE visitor to stay in the waiting area during your procedure. Both you and your visitor must wear masks.   Special note: Every effort is made to have your procedure done on time. Please understand that emergencies sometimes delay scheduled procedures.   2. Diet: Do not eat solid foods after midnight. You may have clear liquids until 5am upon the day of the procedure.   3. Labs: Pre-procedure labs will be drawn TODAY.   4. Medication instructions in preparation for your procedure:            1) Make sure to take your Aspirin and Plavix the AM of your procedure           2) You may take your other meds as directed   5. Plan for one night stay--bring personal belongings.  6. Bring a current list of your medications and current insurance cards.  7. You MUST have a responsible person to drive you home.  8. Someone MUST be with you the first 24 hours after you arrive home or your discharge will be delayed.  9. Please wear clothes that are easy to get on and off and wear slip-on shoes.    FOLLOW-UP APPOINTMENT:  You are scheduled for your follow-up appointment of 11/15/2019 at 2:30PM with Nell Range, PA  (Dr. Antionette Char assistant).

## 2019-10-03 NOTE — H&P (View-Only) (Signed)
[facility] VASCULAR CENTER   STRUCTURAL HEART TEAM  Date:  10/03/2019   ID:  Adam Mayer, DOB [birth year], MRN [id number]  PCP:  Pc, [facility]   Chief Complaint  Patient presents with  . PFO     HISTORY OF PRESENT ILLNESS: Adam Mayer is a 38 y.o. male who presents for evaluation of PFO, referred by Pecolia Ades, NP.  The patient has previously been seen by Dr. Angelena Form for evaluation of heart murmur.  An echocardiogram last year showed normal LV systolic function and no significant valvular disease.  A CTA of the chest was done because of aortic dilatation and this demonstrated a dilated ascending aorta of 4 cm with annual follow-up recommended.  The patient was in his normal state of health until September 12, 2019 when he complained of sudden onset of left arm and leg numbness.  He was evaluated in the emergency department where a code stroke was called.  CT of the brain showed no changes of acute infarction but an MRI showed a small acute infarction in the right corona radiata and small old infarctions in the left caudate nucleus as well as an old lacunar infarction in the left putamen and an old infarction in the left cerebellum.  The patient had an updated echocardiogram with bubble study and this demonstrated the possibility of a small PFO.  Doppler studies were negative for DVT.  The patient was started on aspirin and a statin drug.  He has no residual symptoms and has recovered well.  Hypercoagulable panel was done and showed no significant abnormalities.  The patient subsequently underwent a transesophageal echo which demonstrated a functionally bicuspid aortic valve and a moderate to large PFO with left-to-right color flow as well as evidence of right to left shunting by bubble study.  The patient is here alone today.  His mother is conferenced in over the telephone.  He denies chest pain, chest pressure, shortness of breath, lightheadedness, orthopnea, or PND.    The patient denies any history of nickel allergy or reaction.  Past Medical History:  Diagnosis Date  . Cardiac murmur, unspecified   . Gout     Current Outpatient Medications  Medication Sig Dispense Refill  . allopurinol (ZYLOPRIM) 300 MG tablet Take 300 mg by mouth daily.    [name] atorvastatin (LIPITOR) 40 MG tablet Take 1 tablet (40 mg total) by mouth at bedtime. 90 tablet 3  . Multiple Vitamins-Minerals (MULTIVITAMIN WITH MINERALS) tablet Take 1 tablet by mouth daily.    . Omega-3 Fatty Acids (FISH OIL) 1000 MG CAPS Take 2,000 mg by mouth daily.    [name] aspirin EC 81 MG tablet Take 1 tablet (81 mg total) by mouth daily. 90 tablet 3  . clopidogrel (PLAVIX) 75 MG tablet Take 1 tablet (75 mg total) by mouth daily. 90 tablet 0   No current facility-administered medications for this visit.     ALLERGIES:   Penicillins   SOCIAL HISTORY:  The patient  reports that he has never smoked. He has never used smokeless tobacco. He reports current alcohol use of about 2.0 standard drinks of alcohol per week. He reports that he does not use drugs.   FAMILY HISTORY:  The patient's family history includes Atrial fibrillation in his maternal grandfather; Benign prostatic hyperplasia in his father; CAD in his maternal grandfather; Heart murmur in his mother; Other in his father.   REVIEW OF SYSTEMS:  All other systems are reviewed and negative.   PHYSICAL EXAM:  VS:  BP 116/82   Pulse (!) 52   Ht 6\' 1"  (1.854 m)   Wt 264 lb 9.6 oz (120 kg)   SpO2 97%   BMI 34.91 kg/m  , BMI Body mass index is 34.91 kg/m. GEN: Well nourished, well developed, in no acute distress HEENT: normal Neck: No JVD. carotids 2+ without bruits or masses Cardiac: The heart is RRR without murmurs, rubs, or gallops. No edema. Pedal pulses 2+ = bilaterally  Respiratory:  clear to auscultation bilaterally GI: soft, nontender, nondistended, + BS MS: no deformity or atrophy Skin: warm and dry, no rash Neuro:  Strength and  sensation are intact Psych: euthymic mood, full affect  RECENT LABS: 09/21/2019: BUN 10; Creatinine, Ser 0.76; Hemoglobin 16.4; Platelets 274; Potassium 4.2; Sodium 142  No results found for requested labs within last 8760 hours.   Estimated Creatinine Clearance: 166.5 mL/min (by C-G formula based on SCr of 0.76 mg/dL).   Wt Readings from Last 3 Encounters:  10/03/19 264 lb 9.6 oz (120 kg)  09/27/19 257 lb (116.6 kg)  09/21/19 265 lb (120.2 kg)     PERTINENT STUDIES: TEE: FINDINGS  Left Ventricle: Left ventricular ejection fraction, by visual estimation, is 60 to 65%. The left ventricle has normal function. No evidence of left ventricular regional wall motion abnormalities. There is no left ventricular hypertrophy. Spectral  Doppler shows Left ventricular diastolic function could not be evaluated pattern of LV diastolic filling.  Right Ventricle: The right ventricular size is normal. No increase in right ventricular wall thickness. Global RV systolic function is has normal systolic function.  Left Atrium: Left atrial size was not assessed.  Right Atrium: Right atrial size was not assessed Prominent Chiari network.  Pericardium: There is no evidence of pericardial effusion.  Mitral Valve: The mitral valve is normal in structure. Trace mitral valve regurgitation.  Tricuspid Valve: The tricuspid valve is normal in structure. Tricuspid valve regurgitation is trivial by color flow Doppler.  Aortic Valve: The aortic valve is abnormal. Aortic valve regurgitation is trivial by color flow Doppler. The aortic valve is structurally normal, with no evidence of sclerosis or stenosis. Technically trileaflet, but prominent NCC with partially fused  LCC-RCC. Functions as bicuspid, with trivial regurgitation at the commissure.  Pulmonic Valve: The pulmonic valve was grossly normal. Pulmonic valve regurgitation is not visualized by color flow Doppler.  Aorta: Aortic dilatation noted.  There is moderate dilatation of the ascending aorta measuring 42 mm.  Shunts: Agitated saline contrast was given intravenously to evaluate for intracardiac shunting. Saline contrast bubble study was positive, with evidence of interatrial shunting. A moderately sized patent foramen ovale is detected with bidirectional  shunting across atrial septum. Evidence of atrial level shunting detected by color flow Doppler.   ASSESSMENT AND PLAN: 1.  PFO -the patient presents with a recent cryptogenic stroke.  There is MRI evidence of both an acute as well as multiple prior events and no single vascular territory.  This is highly suggestive of cardioembolism.  I personally reviewed the patient's transesophageal echo images and he has a large PFO which anatomically places him at higher risk of recurrent stroke.  The patient's Rope Score is 8, indicating a high probability that the patient's stroke is 'PFO-related.'  The patient is counseled about the association of PFO and cryptogenic stroke. Available clinical trial data is reviewed, specifically those trials comparing transcatheter PFO closure and medical therapy with antiplatelet drugs. The patient understands the potential benefit of PFO closure with respect to  secondary stroke reduction compared with medical therapy alone. Specific risks of transcatheter PFO closure are reviewed with the patient. These risks include bleeding, infection, device embolization, stroke, cardiac perforation, tamponade, arrhythmia, MI, and late device erosion. He understands these serious risks occur at low incidence of < 1%.  The patient understands the need to take dual antiplatelet therapy with aspirin and clopidogrel together for a minimum period of 3 months following transcatheter PFO closure.  They understand the need to follow SBE prophylaxis per guidelines for a period of 6 months following PFO closure.  After understanding potential risks and benefits, the patient would like  to proceed with transcatheter closure.  The patient provides full informed consent for the procedure.   Tonny Bollman 10/03/2019 1:23 PM

## 2019-10-04 ENCOUNTER — Other Ambulatory Visit (HOSPITAL_COMMUNITY)
Admission: RE | Admit: 2019-10-04 | Discharge: 2019-10-04 | Disposition: A | Discharge status: Home / Self Care | Payer: 59 | Source: Ambulatory Visit | Attending: Cardiovascular Disease | Admitting: Cardiovascular Disease

## 2019-10-04 ENCOUNTER — Telehealth: Payer: Self-pay | Admitting: *Deleted

## 2019-10-04 DIAGNOSIS — Z20828 Contact with and (suspected) exposure to other viral communicable diseases: Secondary | ICD-10-CM | POA: Diagnosis not present

## 2019-10-04 DIAGNOSIS — Z01812 Encounter for preprocedural laboratory examination: Secondary | ICD-10-CM | POA: Insufficient documentation

## 2019-10-04 LAB — SARS CORONAVIRUS 2 (TAT 6-24 HRS): SARS Coronavirus 2: NEGATIVE

## 2019-10-04 NOTE — Telephone Encounter (Signed)
Pt contacted pre-PFO closure scheduled at [facility] for: Friday October 05, 2019 12 noon Verified arrival time and place: [facility]) at: 10 AM   No solid food after midnight prior to cath, clear liquids until 5 AM day of procedure. Contrast allergy: no  AM meds can be  taken pre-cath with sip of water including: ASA 81 mg Plavix 75 mg  Confirmed patient has responsible adult to drive home post procedure and observe 24 hours after arriving home: yes  Currently, due to Covid-19 pandemic, only one support person will be allowed with patient. Must be the same support person for that patient's entire stay, will be screened and required to wear a mask. They will be asked to wait in the waiting room for the duration of the patient's stay.  Patients are required to wear a mask when they enter the hospital.      COVID-19 Pre-Screening Questions:  . In the past 7 to 10 days have you had a cough,  shortness of breath, headache, congestion, fever (100 or greater) body aches, chills, sore throat, or sudden loss of taste or sense of smell? no . Have you been around anyone with known Covid 19? no . Have you been around anyone who is awaiting Covid 19 test results in the past 7 to 10 days? no . Have you been around anyone who has been exposed to Covid 19, or has mentioned symptoms of Covid 19 within the past 7 to 10 days? No  I reviewed procedure/mask/visitor, Covid-19 screening questions with patient, he verbalized understanding, thanked me for call.

## 2019-10-04 NOTE — Progress Notes (Signed)
Patient is going to [facility] to get COVID test today

## 2019-10-05 ENCOUNTER — Ambulatory Visit (HOSPITAL_COMMUNITY)
Admission: RE | Admit: 2019-10-05 | Discharge: 2019-10-05 | Disposition: A | Discharge status: Home / Self Care | Payer: 59 | Attending: Cardiovascular Disease | Admitting: Cardiovascular Disease

## 2019-10-05 ENCOUNTER — Encounter (HOSPITAL_COMMUNITY)
Admission: RE | Disposition: A | Discharge status: Home / Self Care | Payer: Self-pay | Source: Home / Self Care | Attending: Cardiovascular Disease

## 2019-10-05 ENCOUNTER — Ambulatory Visit (HOSPITAL_BASED_OUTPATIENT_CLINIC_OR_DEPARTMENT_OTHER): Payer: 59

## 2019-10-05 ENCOUNTER — Other Ambulatory Visit: Payer: Self-pay | Admitting: Physician Assistant

## 2019-10-05 ENCOUNTER — Other Ambulatory Visit: Payer: Self-pay

## 2019-10-05 DIAGNOSIS — Z7902 Long term (current) use of antithrombotics/antiplatelets: Secondary | ICD-10-CM | POA: Insufficient documentation

## 2019-10-05 DIAGNOSIS — Q2112 Patent foramen ovale: Secondary | ICD-10-CM

## 2019-10-05 DIAGNOSIS — Z79899 Other long term (current) drug therapy: Secondary | ICD-10-CM | POA: Insufficient documentation

## 2019-10-05 DIAGNOSIS — M109 Gout, unspecified: Secondary | ICD-10-CM | POA: Diagnosis not present

## 2019-10-05 DIAGNOSIS — Q211 Atrial septal defect: Secondary | ICD-10-CM

## 2019-10-05 DIAGNOSIS — Z880 Allergy status to penicillin: Secondary | ICD-10-CM | POA: Diagnosis not present

## 2019-10-05 DIAGNOSIS — Z7982 Long term (current) use of aspirin: Secondary | ICD-10-CM | POA: Insufficient documentation

## 2019-10-05 HISTORY — PX: PATENT FORAMEN OVALE(PFO) CLOSURE: CATH118300

## 2019-10-05 LAB — ECHOCARDIOGRAM LIMITED
Height: 73 in
Weight: 4160 oz

## 2019-10-05 LAB — POCT ACTIVATED CLOTTING TIME
Activated Clotting Time: 246 seconds
Activated Clotting Time: 175 seconds

## 2019-10-05 SURGERY — PATENT FORAMEN OVALE (PFO) CLOSURE
Anesthesia: LOCAL

## 2019-10-05 MED ORDER — ASPIRIN 81 MG PO CHEW: 81.0000 mg | CHEWABLE_TABLET | ORAL | Status: DC

## 2019-10-05 MED ORDER — FENTANYL CITRATE (PF) 100 MCG/2ML IJ SOLN
INTRAMUSCULAR | Status: DC | PRN
  Administered 2019-10-05 (×2): 25 ug via INTRAVENOUS

## 2019-10-05 MED ORDER — LABETALOL HCL 5 MG/ML IV SOLN: 10.0000 mg | INTRAVENOUS | Status: DC | PRN

## 2019-10-05 MED ORDER — MIDAZOLAM HCL 2 MG/2ML IJ SOLN
INTRAMUSCULAR | Status: AC
  Filled 2019-10-05: qty 2

## 2019-10-05 MED ORDER — MIDAZOLAM HCL 2 MG/2ML IJ SOLN
INTRAMUSCULAR | Status: DC | PRN
  Administered 2019-10-05 (×2): 2 mg via INTRAVENOUS

## 2019-10-05 MED ORDER — CLOPIDOGREL BISULFATE 75 MG PO TABS: 75.0000 mg | ORAL_TABLET | ORAL | Status: DC

## 2019-10-05 MED ORDER — CLINDAMYCIN HCL 300 MG PO CAPS: 600.0000 mg | ORAL_CAPSULE | ORAL | 0 refills | Status: DC

## 2019-10-05 MED ORDER — SODIUM CHLORIDE 0.9% FLUSH: 3.0000 mL | Freq: Two times a day (BID) | INTRAVENOUS | Status: DC

## 2019-10-05 MED ORDER — SODIUM CHLORIDE 0.9 % WEIGHT BASED INFUSION: 1.0000 mL/kg/h | INTRAVENOUS | Status: DC

## 2019-10-05 MED ORDER — ONDANSETRON HCL 4 MG/2ML IJ SOLN: 4.0000 mg | Freq: Four times a day (QID) | INTRAMUSCULAR | Status: DC | PRN

## 2019-10-05 MED ORDER — SODIUM CHLORIDE 0.9% FLUSH: 3.0000 mL | INTRAVENOUS | Status: DC | PRN

## 2019-10-05 MED ORDER — SODIUM CHLORIDE 0.9 % WEIGHT BASED INFUSION
3.0000 mL/kg/h | INTRAVENOUS | Status: AC
  Administered 2019-10-05: 3 mL/kg/h via INTRAVENOUS

## 2019-10-05 MED ORDER — HEPARIN (PORCINE) IN NACL 1000-0.9 UT/500ML-% IV SOLN
INTRAVENOUS | Status: DC | PRN
  Administered 2019-10-05: 500 mL

## 2019-10-05 MED ORDER — HYDRALAZINE HCL 20 MG/ML IJ SOLN: 10.0000 mg | INTRAMUSCULAR | Status: DC | PRN

## 2019-10-05 MED ORDER — SODIUM CHLORIDE 0.9 % IV SOLN: INTRAVENOUS | Status: AC

## 2019-10-05 MED ORDER — HEPARIN SODIUM (PORCINE) 1000 UNIT/ML IJ SOLN
INTRAMUSCULAR | Status: DC | PRN
  Administered 2019-10-05: 9000 [IU] via INTRAVENOUS

## 2019-10-05 MED ORDER — ACETAMINOPHEN 325 MG PO TABS: 650.0000 mg | ORAL_TABLET | ORAL | Status: DC | PRN

## 2019-10-05 MED ORDER — SODIUM CHLORIDE 0.9 % IV SOLN
INTRAVENOUS | Status: AC | PRN
  Administered 2019-10-05: 10 mL/h via INTRAVENOUS

## 2019-10-05 MED ORDER — LIDOCAINE HCL (PF) 1 % IJ SOLN
INTRAMUSCULAR | Status: AC
  Filled 2019-10-05: qty 30

## 2019-10-05 MED ORDER — FENTANYL CITRATE (PF) 100 MCG/2ML IJ SOLN
INTRAMUSCULAR | Status: AC
  Filled 2019-10-05: qty 2

## 2019-10-05 MED ORDER — LIDOCAINE HCL (PF) 1 % IJ SOLN
INTRAMUSCULAR | Status: DC | PRN
  Administered 2019-10-05: 20 mL

## 2019-10-05 MED ORDER — HEPARIN (PORCINE) IN NACL 1000-0.9 UT/500ML-% IV SOLN
INTRAVENOUS | Status: AC
  Filled 2019-10-05: qty 500

## 2019-10-05 MED ORDER — SODIUM CHLORIDE 0.9 % IV SOLN: 250.0000 mL | INTRAVENOUS | Status: DC | PRN

## 2019-10-05 MED ORDER — VANCOMYCIN HCL 10 G IV SOLR
1500.0000 mg | INTRAVENOUS | Status: AC
  Administered 2019-10-05: 1500 mg via INTRAVENOUS
  Filled 2019-10-05: qty 1500

## 2019-10-05 MED ORDER — HEPARIN SODIUM (PORCINE) 1000 UNIT/ML IJ SOLN
INTRAMUSCULAR | Status: AC
  Filled 2019-10-05: qty 1

## 2019-10-05 SURGICAL SUPPLY — 15 items
CATH ACUNAV 8FR 90CM (CATHETERS) ×1 IMPLANT
CATH INFINITI 6F MPA2 100CM (CATHETERS) ×1 IMPLANT
COVER SWIFTLINK CONNECTOR (BAG) ×1 IMPLANT
GUIDEWIRE AMPLATZER 1.5JX260 (WIRE) ×1 IMPLANT
OCCLUDER AMPLATZER PFO 25MM (Prosthesis & Implant Heart) ×1 IMPLANT
PACK CARDIAC CATHETERIZATION (CUSTOM PROCEDURE TRAY) ×2 IMPLANT
PROTECTION STATION PRESSURIZED (MISCELLANEOUS) ×4
SHEATH INTROD W/O MIN 9FR 25CM (SHEATH) ×1 IMPLANT
SHEATH PINNACLE 8F 10CM (SHEATH) ×1 IMPLANT
SHEATH PROBE COVER 6X72 (BAG) ×1 IMPLANT
STATION PROTECTION PRESSURIZED (MISCELLANEOUS) IMPLANT
SYS DELIVER AMP TREVISIO 8FR (SHEATH) ×2
SYSTEM DELIVER AMP TREVIS 8FR (SHEATH) IMPLANT
TUBING CIL FLEX 10 FLL-RA (TUBING) ×2 IMPLANT
WIRE EMERALD 3MM-J .035X150CM (WIRE) ×1 IMPLANT

## 2019-10-05 NOTE — Progress Notes (Signed)
Sheath removal. 8Fr and 9Fr sheath removed from right femoral vein. Manual pressure held for approx 15 min. Pt given instructions on potential sheath removal complications and S&S plus post sheath removal instructions. Pt voiced understanding of same.

## 2019-10-05 NOTE — Progress Notes (Signed)
No bleeding or swelling noted after ambulation 

## 2019-10-05 NOTE — Discharge Instructions (Signed)
You will require antibiotics prior to any dental work, including cleanings, for 6 months after your PFO/ASD closure. This is to protect the device from potentially getting infected from bacteria in your mouth entering your bloodstream. The medication has been called into your pharmacy on file. Please pick this up to have ready before any scheduled dental work. Instructions will be outlined on the bottle. The medication should be taken 1 hour prior to your dental appointment. ° ° °Groin Site Care °Refer to this sheet in the next few weeks. These instructions provide you with information on caring for yourself after your procedure. Your caregiver may also give you more specific instructions. Your treatment has been planned according to current medical practices, but problems sometimes occur. Call your caregiver if you have any problems or questions after your procedure. °HOME CARE INSTRUCTIONS °· You may shower 24 hours after the procedure. Remove the bandage (dressing) and gently wash the site with plain soap and water. Gently pat the site dry.  °· Do not apply powder or lotion to the site.  °· Do not sit in a bathtub, swimming pool, or whirlpool for 5 to 7 days.  °· No bending, squatting, or lifting anything over 10 pounds (4.5 kg) for 1 week °· Inspect the site at least twice daily.  °· Do not drive home if you are discharged the same day of the procedure. Have someone else drive you.  °· You may drive 24 hours after the procedure unless otherwise instructed by your caregiver.  °What to expect: °· Any bruising will usually fade within 1 to 2 weeks.  °· Blood that collects in the tissue (hematoma) may be painful to the touch. It should usually decrease in size and tenderness within 1 to 2 weeks.  °SEEK IMMEDIATE MEDICAL CARE IF: °· You have unusual pain at the groin site or down the affected leg.  °· You have redness, warmth, swelling, or pain at the groin site.  °· You have drainage (other than a small amount of  blood on the dressing).  °· You have chills.  °· You have a fever or persistent symptoms for more than 72 hours.  °· You have a fever and your symptoms suddenly get worse.  °· Your leg becomes pale, cool, tingly, or numb.  °You have heavy bleeding from the site. Hold pressure on the site. ° °

## 2019-10-05 NOTE — Progress Notes (Signed)
  Echocardiogram 2D Echocardiogram has been performed.  Adam Mayer 10/05/2019, 4:19 PM

## 2019-10-05 NOTE — Interval H&P Note (Signed)
History and Physical Interval Note:  10/05/2019 1:01 PM  Adam Mayer  has presented today for surgery, with the diagnosis of pfo.  The various methods of treatment have been discussed with the patient and family. After consideration of risks, benefits and other options for treatment, the patient has consented to  Procedure(s): PATENT FORAMEN OVALE (PFO) CLOSURE (N/A) as a surgical intervention.  The patient's history has been reviewed, patient examined, no change in status, stable for surgery.  I have reviewed the patient's chart and labs.  Questions were answered to the patient's satisfaction.     Sherren Mocha

## 2019-10-08 ENCOUNTER — Encounter (HOSPITAL_COMMUNITY): Payer: Self-pay | Admitting: Cardiovascular Disease

## 2019-11-14 ENCOUNTER — Telehealth: Payer: Self-pay | Admitting: Neurology

## 2019-11-14 NOTE — Telephone Encounter (Signed)
I called patient and LVM regarding 12/15 appt d/t MD out of town. I advised patient could do a Mychart visit or r/s to in office on 12/29. Requested he call back.

## 2019-11-15 ENCOUNTER — Ambulatory Visit (INDEPENDENT_AMBULATORY_CARE_PROVIDER_SITE_OTHER): Payer: 59 | Admitting: Physician Assistant

## 2019-11-15 ENCOUNTER — Encounter: Payer: Self-pay | Admitting: Physician Assistant

## 2019-11-15 ENCOUNTER — Other Ambulatory Visit: Payer: Self-pay

## 2019-11-15 VITALS — BP 122/76 | HR 57 | Ht 73.0 in | Wt 268.8 lb

## 2019-11-15 DIAGNOSIS — I712 Thoracic aortic aneurysm, without rupture, unspecified: Secondary | ICD-10-CM

## 2019-11-15 DIAGNOSIS — Z8774 Personal history of (corrected) congenital malformations of heart and circulatory system: Secondary | ICD-10-CM

## 2019-11-15 NOTE — Telephone Encounter (Signed)
LEft second vm for pt to call back and convert visit to [employer] video. MD will not be in the office to see pts but can do a mychart video visit with him. LEft pt a message on mychart.

## 2019-11-15 NOTE — Patient Instructions (Signed)
Medication Instructions:  1) you may STOP PLAVIX January 05, 2020  Labwork: TODAY: BMET  Testing/Procedures: Please schedule chest CTA per Dr. Angelena Form in January, 2021.  Follow-Up: Your provider recommends that you schedule a follow-up appointment after your chest CT in January.  You will be called to arrange your 1 year closure echo and office visit.

## 2019-11-15 NOTE — Progress Notes (Signed)
Thanks

## 2019-11-15 NOTE — Progress Notes (Signed)
[facility]   MULTIDISCIPLINARY HEART VALVE CLINIC                                       Cardiology Office Note    Date:  11/15/2019   ID:  Adam AntuChristopher Lamp, DOB 02/14/1981, MRN [id number]  PCP:  Pc, [facility]  Cardiologist: Dr. Clifton JamesMcAlhany  CC: 1 month s/p PFO closure   History of Present Illness:  Adam Mayer is a 38 y.o. male with a history of functionally bicuspid aortic valve with ad dilated ascending aorta, CVA and PFO s/p PFO closure (10/05/19) who presents to clinic for follow up.    The patient has previously been seen by Dr. Clifton JamesMcAlhany for evaluation of heart murmur.  An echocardiogram last year showed normal LV systolic function and no significant valvular disease.  A CTA of the chest was done because of aortic dilatation and this demonstrated a dilated ascending aorta of 4 cm with annual follow-up recommended.  The patient was in his normal state of health until September 12, 2019 when he complained of sudden onset of left arm and leg numbness.  He was evaluated in the emergency department where a code stroke was called.  CT of the brain showed no changes of acute infarction but an MRI showed a small acute infarction in the right corona radiata and small old infarctions in the left caudate nucleus as well as an old lacunar infarction in the left putamen and an old infarction in the left cerebellum.  The patient had an updated echocardiogram with bubble study and this demonstrated the possibility of a small PFO.  Doppler studies were negative for DVT.  The patient was started on aspirin and a statin drug.  He has no residual symptoms and has recovered well.  Hypercoagulable panel was done and showed no significant abnormalities.  The patient subsequently underwent a transesophageal echo which demonstrated a functionally bicuspid aortic valve and a moderate to large PFO with left-to-right color flow as well as evidence of right to left shunting by bubble  study. He was seen by Dr. Excell Seltzerooper for consultation for PFO closure.   He underwent successful transcatheter PFO closure using a 25 mm Amplatzer PFO. Post op echo showed EF 65% with normally functioning device and no atrial level shunt. He was discharged on aspirin and plavix.  Today he presents to clinic for follow up. No CP or SOB. No LE edema, orthopnea or PND. No dizziness or syncope. No blood in stool or urine. No palpitations. He is very worried about his ascending aortic aneurysm.    Past Medical History:  Diagnosis Date  . Cardiac murmur, unspecified   . Gout     Past Surgical History:  Procedure Laterality Date  . BUBBLE STUDY  09/27/2019   Procedure: BUBBLE STUDY;  Surgeon: Jodelle Redhristopher, Bridgette, MD;  [facility];  Service: Cardiovascular;;  . PATENT FORAMEN OVALE(PFO) CLOSURE N/A 10/05/2019   Procedure: PATENT FORAMEN OVALE (PFO) CLOSURE;  Surgeon: Tonny Bollmanooper, Michael, MD;  [facility];  Service: Cardiovascular;  Laterality: N/A;  . TEE WITHOUT CARDIOVERSION N/A 09/27/2019   Procedure: TRANSESOPHAGEAL ECHOCARDIOGRAM (TEE);  Surgeon: Jodelle Redhristopher, Bridgette, MD;  [facility];  Service: Cardiovascular;  Laterality: N/A;    Current Medications: Outpatient Medications Prior to Visit  Medication Sig Dispense Refill  . allopurinol (ZYLOPRIM) 300 MG tablet Take 300 mg by mouth daily.    .[name]  aspirin EC 81 MG tablet Take 1 tablet (81 mg total) by mouth daily. 90 tablet 3  . atorvastatin (LIPITOR) 40 MG tablet Take 1 tablet (40 mg total) by mouth at bedtime. 90 tablet 3  . clindamycin (CLEOCIN) 300 MG capsule Take 2 capsules (600 mg total) by mouth as directed. Take 2 capsules 1 hour prior to dental work, including cleanings for 6 months 8 capsule 0  . clopidogrel (PLAVIX) 75 MG tablet Take 1 tablet (75 mg total) by mouth daily. 90 tablet 0  . Multiple Vitamins-Minerals (MULTIVITAMIN WITH MINERALS) tablet Take 1 tablet by mouth daily.    . Omega-3 Fatty  Acids (FISH OIL) 1000 MG CAPS Take 2,000 mg by mouth daily.     No facility-administered medications prior to visit.     Allergies:   Penicillins   Social History   Socioeconomic History  . Marital status: Married    Spouse name: Not on file  . Number of children: 1  . Years of education: Not on file  . Highest education level: Not on file  Occupational History  . Occupation: manages a Chief Technology Officer  Tobacco Use  . Smoking status: Never Smoker  . Smokeless tobacco: Never Used  Substance and Sexual Activity  . Alcohol use: Yes    Alcohol/week: 2.0 standard drinks    Types: 2 Cans of beer per week    Comment: social  . Drug use: Never  . Sexual activity: Not on file  Other Topics Concern  . Not on file  Social History Narrative  . Not on file   Social Determinants of Health   Financial Resource Strain:   . Difficulty of Paying Living Expenses: Not on file  Food Insecurity:   . Worried About Programme researcher, broadcasting/film/video in the Last Year: Not on file  . Ran Out of Food in the Last Year: Not on file  Transportation Needs:   . Lack of Transportation (Medical): Not on file  . Lack of Transportation (Non-Medical): Not on file  Physical Activity:   . Days of Exercise per Week: Not on file  . Minutes of Exercise per Session: Not on file  Stress:   . Feeling of Stress : Not on file  Social Connections:   . Frequency of Communication with Friends and Family: Not on file  . Frequency of Social Gatherings with Friends and Family: Not on file  . Attends Religious Services: Not on file  . Active Member of Clubs or Organizations: Not on file  . Attends Banker Meetings: Not on file  . Marital Status: Not on file     Family History:  The patient's family history includes Atrial fibrillation in his maternal grandfather; Benign prostatic hyperplasia in his father; CAD in his maternal grandfather; Heart murmur in his mother; Other in his father.     ROS:   Please  see the history of present illness.    ROS All other systems reviewed and are negative.   PHYSICAL EXAM:   VS:  BP 122/76   Pulse (!) 57   Ht 6\' 1"  (1.854 m)   Wt 268 lb 12.8 oz (121.9 kg)   SpO2 98%   BMI 35.46 kg/m    GEN: Well nourished, well developed, in no acute distress HEENT: normal Neck: no JVD or masses Cardiac: RRR; no murmurs, rubs, or gallops,no edema  Respiratory:  clear to auscultation bilaterally, normal work of breathing GI: soft, nontender, nondistended, + BS MS: no deformity  or atrophy Skin: warm and dry, no rash Neuro:  Alert and Oriented x 3, Strength and sensation are intact Psych: euthymic mood, full affect   Wt Readings from Last 3 Encounters:  11/15/19 268 lb 12.8 oz (121.9 kg)  10/05/19 260 lb (117.9 kg)  10/03/19 264 lb 9.6 oz (120 kg)      Studies/Labs Reviewed:   EKG:  EKG is NOT ordered today.  Recent Labs: 09/21/2019: BUN 10; Creatinine, Ser 0.76; Hemoglobin 16.4; Platelets 274; Potassium 4.2; Sodium 142   Lipid Panel No results found for: CHOL, TRIG, HDL, CHOLHDL, VLDL, LDLCALC, LDLDIRECT  Additional studies/ records that were reviewed today include:  10/05/19 PATENT FORAMEN OVALE (PFO) CLOSURE  Conclusion Successful transcatheter PFO closure using a 25 mm Amplatzer PFO Occluder under fluoroscopic and intracardiac echo guidance  Recommendations Antiplatelet/Anticoag Recommend uninterrupted dual antiplatelet therapy with Aspirin 81mg  daily and Clopidogrel 75mg  daily for 3 months.   ______________  Echo 10/05/19 IMPRESSIONS  1. Left ventricular ejection fraction, by visual estimation, is 60 to 65%. The left ventricle has normal function. Normal left ventricular size. There is mildly increased left ventricular hypertrophy.  2. Left ventricular diastolic parameters are consistent with Grade I diastolic dysfunction (impaired relaxation).  3. Global right ventricle has normal systolic function.The right ventricular size is normal. No  increase in right ventricular wall thickness.  4. Left atrial size was normal.  5. Right atrial size was normal.  6. The mitral valve is normal in structure. No evidence of mitral valve regurgitation. No evidence of mitral stenosis.  7. The tricuspid valve is normal in structure. Tricuspid valve regurgitation is trivial.  8. The aortic valve is normal in structure. Aortic valve regurgitation is not visualized. No evidence of aortic valve sclerosis or stenosis.  9. The pulmonic valve was not assessed. Pulmonic valve regurgitation is not visualized. 10. The inferior vena cava is normal in size with greater than 50% respiratory variability, suggesting right atrial pressure of 3 mmHg. 11. An Amplatzer device is seen in the interatrial septum with no obvious left to right shunting. No subcostal views. Consider a bubble study for further assessment.  ASSESSMENT & PLAN:   PFO s/p PFO closure: doing well. Groin site healing well. Continue aspirin and plavix. He can discontinue plavix after 3 months (12/2019). SBE prophylaxis discussed; I have RX'd clindamycin due to a PCN allergy. He understands this is only for 6 months. I will see him back in 1 year for follow up and echo with bubble study.  Functionally bicuspid aortic valve with enlarged ascending aorta: he has been very worried about this. We discussed how this will just be followed over time. He is due for repeat CT angio in January. I will go ahead and get this set up. Will get BMET today in anticipation of this.    Medication Adjustments/Labs and Tests Ordered: Current medicines are reviewed at length with the patient today.  Concerns regarding medicines are outlined above.  Medication changes, Labs and Tests ordered today are listed in the Patient Instructions below. Patient Instructions  Medication Instructions:  1) you may STOP PLAVIX January 05, 2020  Labwork: TODAY: BMET  Testing/Procedures: Please schedule chest CTA per Dr. Angelena Form  in January, 2021.  Follow-Up: Your provider recommends that you schedule a follow-up appointment after your chest CT in January.  You will be called to arrange your 1 year closure echo and office visit.     Signed, Angelena Form, PA-C  11/15/2019 3:44 PM    [facility] [address], [place], [place]  [address] Phone: [phone]; Fax: [phone]

## 2019-11-15 NOTE — Telephone Encounter (Signed)
Pt has called, the message was relayed to him from RN.  Pt has agreed to a my chart, he has a complete understanding as to how to access my chart, he was given the my chart help desk # in the event of an issue.  This is FYI, no call back requested

## 2019-11-20 ENCOUNTER — Telehealth (INDEPENDENT_AMBULATORY_CARE_PROVIDER_SITE_OTHER): Payer: 59 | Admitting: Neurology

## 2019-11-20 ENCOUNTER — Encounter: Payer: Self-pay | Admitting: Neurology

## 2019-11-20 DIAGNOSIS — I639 Cerebral infarction, unspecified: Secondary | ICD-10-CM | POA: Diagnosis not present

## 2019-11-20 NOTE — Progress Notes (Signed)
Virtual Visit via Video Note  I connected with Adam Mayer on 11/20/19 at  3:00 PM EST by a video enabled telemedicine application and verified that I am speaking with the correct person using two identifiers.  Location: Patient:at home Provider:at office Ref MD : Sherren Mocha Reason for referral : stroke  I discussed the limitations of evaluation and management by telemedicine and the availability of in person appointments. The patient expressed understanding and agreed to proceed.  History of Present Illness: Mr Adam Mayer is a pleasant 38 year old Caucasian male seen today for initial office virtual video consultation visit for stroke.  History is obtained from the patient, review of electronic medical records, care everywhere and imaging results.  Actual imaging films could not be reviewed personally.  He is obese middle-age Caucasian male who developed an episode of sudden onset of left body numbness on 09/13/2019.  This lasted around 12 hours and recovered completely.  He was admitted to [facility] in [place] where CT scan of the head was unremarkable.  MRI scan of the brain showed no acute 1.2 cm right perirolandic white matter infarct.  There are old infarcts noted in the left putamen, basal ganglia as well as left cerebellum as well.  MRI of the brain showed no large vessel stenosis or occlusion.  CT angiogram of the brain and neck were also obtained which showed no large vessel stenosis either.  Transthoracic echo was unremarkable except for presence of right to left shunt after agitated saline injection..  TEE on 09/27/2019 showed normal ejection fraction but moderate size patent foramen ovale with bidirectional shunting across the atrial septum.  Lab results for lipid profile and A1c are not available.  Hypercoagulable panel labs were all unremarkable.  Patient was started on aspirin.  His ROPE score was six with only a 60% chance of his strokes being attributable to PFO but  he was referred for endovascular closure.  He was referred to Dr. Sherren Mocha interventional cardiologist who confirmed the PFO by TEE and patient underwent elective endovascular PFO closure on 10/05/2019 uneventfully.  Postprocedure TEE showed no residual shunt.  Patient is currently on aspirin and Plavix and tolerating both medications well without any bruising or bleeding.  Is also on a statin as well as blood pressure medications.  Is tolerating his medications well without any significant side effects.  He states left-sided paresthesias have not recurred and has had no further recurrent stroke or TIA symptoms.  The patient has never been evaluated for sleep apnea.  He has no complaints today.  He has no prior history of strokes, TIAs, deep vein thrombosis pulmonary embolism or family history of strokes or TIAs in a young age.    Observations/Objective: Physical and neurological exams are limited due to constraints from virtual video visit.  Pleasant obese middle-aged Caucasian male not in distress. Neurological Exam ;  Awake  Alert oriented x 3. Normal speech and language.eye movements full without nystagmus.fundi were not visualized. Vision acuity and fields appear normal. Hearing is normal. Palatal movements are normal. Face symmetric. Tongue midline. Normal strength, tone, reflexes and coordination. Normal sensation. Gait deferred.   Assessment and Plan: 38 year old Caucasian male with recent right subcortical infarct likely from small vessel disease but brain imaging shows multiple old infarcts involving left basal ganglia and left cerebellum raising possibility of cryptogenic etiology.  Vascular risk factors of obesity, hypertension and hyperlipidemia.  Follow Up Instructions: Continue aspirin 81 mg and Plavix 75 mg daily for 3 months and then  switch to aspirin alone if okay with Dr. Excell Seltzer for secondary stroke prevention.  Maintain aggressive risk factor modification with strict control of  hypertension with blood pressure goal below 130/90, lipids with LDL cholesterol goal below 70 mg percent.  Patient was also encouraged to eat a healthy diet and to exercise regularly and lose weight.  He will also benefit with consideration for evaluation for obstructive sleep apnea.  I will refer him for polysomnogram sleep study for the same.  Check transcranial Doppler bubble study to look for adequacy of endovascular PFO closure.  He will return for follow-up in 3 months with my nurse practitioner Shanda Bumps or call earlier if necessary.   I discussed the assessment and treatment plan with the patient. The patient was provided an opportunity to ask questions and all were answered. The patient agreed with the plan and demonstrated an understanding of the instructions.   The patient was advised to call back or seek an in-person evaluation if the symptoms worsen or if the condition fails to improve as anticipated.  I provided 45 minutes of non-face-to-face time during this encounter.   Delia Heady, MD

## 2019-11-22 ENCOUNTER — Telehealth: Payer: Self-pay

## 2019-11-22 NOTE — Telephone Encounter (Signed)
If patient calls back please schedule 3 month follow up with Janett Billow NP. This is per Dr. Leonie Man note.  VM was left for patient to call back to r/s 3 month with Janett Billow NP.

## 2019-11-26 ENCOUNTER — Ambulatory Visit (HOSPITAL_COMMUNITY)
Admission: RE | Admit: 2019-11-26 | Discharge: 2019-11-26 | Disposition: A | Discharge status: Home / Self Care | Payer: 59 | Source: Ambulatory Visit | Attending: Neurology | Admitting: Neurology

## 2019-11-26 ENCOUNTER — Other Ambulatory Visit: Payer: Self-pay

## 2019-11-26 DIAGNOSIS — I639 Cerebral infarction, unspecified: Secondary | ICD-10-CM | POA: Diagnosis present

## 2019-11-26 NOTE — Progress Notes (Signed)
TCD bubble study has been completed.   Preliminary results in CV Proc.   Abram Sander 11/26/2019 1:36 PM

## 2019-11-26 NOTE — Telephone Encounter (Signed)
Sent pt mychart message to schedule with Janett Billow NP for 3 month follow up. This is per last note from MD.

## 2019-12-12 ENCOUNTER — Ambulatory Visit (INDEPENDENT_AMBULATORY_CARE_PROVIDER_SITE_OTHER): Payer: 59 | Admitting: Neurology

## 2019-12-12 ENCOUNTER — Other Ambulatory Visit: Payer: Self-pay

## 2019-12-12 ENCOUNTER — Other Ambulatory Visit: Payer: PRIVATE HEALTH INSURANCE | Admitting: *Deleted

## 2019-12-12 ENCOUNTER — Encounter: Payer: Self-pay | Admitting: Neurology

## 2019-12-12 VITALS — BP 119/79 | HR 55 | Temp 97.8°F | Ht 73.0 in | Wt 271.5 lb

## 2019-12-12 DIAGNOSIS — Z8774 Personal history of (corrected) congenital malformations of heart and circulatory system: Secondary | ICD-10-CM

## 2019-12-12 DIAGNOSIS — E669 Obesity, unspecified: Secondary | ICD-10-CM

## 2019-12-12 DIAGNOSIS — R0683 Snoring: Secondary | ICD-10-CM

## 2019-12-12 DIAGNOSIS — I639 Cerebral infarction, unspecified: Secondary | ICD-10-CM | POA: Diagnosis not present

## 2019-12-12 NOTE — Patient Instructions (Signed)

## 2019-12-12 NOTE — Progress Notes (Deleted)
Subjective:    Patient ID: Adam Mayer is a 39 y.o. male.  HPI {Common ambulatory SmartLinks:[id number]}  Review of Systems  Neurological:       Snoring Epworth Sleepiness Scale 0= would never doze 1= slight chance of dozing 2= moderate chance of dozing 3= high chance of dozing  Sitting and reading:1 Watching TV:1 Sitting inactive in a public place (ex. Theater or meeting):0 As a passenger in a car for an hour without a break:0 Lying down to rest in the afternoon:0 Sitting and talking to someone:0 Sitting quietly after lunch (no alcohol):0 In a car, while stopped in traffic:0 Total:2     Objective:  Neurological Exam  Physical Exam  Assessment:   ***  Plan:   ***

## 2019-12-12 NOTE — Progress Notes (Signed)
Subjective:    Patient ID: Adam Mayer is a 39 y.o. male.  HPI     Adam Foley, MD, PhD [facility] [address] [address] [place], [place] [address]  Dear Janalyn Shy,   I saw your patient, Adam Mayer, upon your kind request in my sleep clinic today for initial consultation of his sleep disorder, in particular, concern for underlying obstructive sleep apnea.  The patient is unaccompanied today.  As you know, Adam Mayer is a 39 year old right-handed gentleman with an underlying medical history of thoracic aneurysm, status post PAF closure, stroke, gout, and obesity, who reports snoring and occ. Sleep disruption.  I reviewed your telemedicine note from 1 01/20/2019.  His Epworth sleepiness score is 2 out of 24, fatigue severity score is 9 out of 63.  He snores intermittently.  He has not woken up with a sense of gasping for air.  He had PFO closure a few months ago.  He is not aware of any family history of OSA.  He denies night to night nocturia or recurrent morning headaches.  He lives with his wife and 58-month-old daughter who sleeps in her own bedroom.  They have a dog in the household but she does not bother him at night, he does not have a TV in the bedroom.  He is a non-smoker and drinks alcohol occasionally, up to 3 beers a week, caffeine, in the form of coffee, 1-2 large cups per day, no daily soda or tea.  Bedtime is generally around 930 or 10 and rise time between 5 and 6.  He has recuperated well from his stroke and has no residual symptoms.  He has a history of heart murmur since childhood. His Past Medical History Is Significant For: Past Medical History:  Diagnosis Date  . Cardiac murmur, unspecified   . Gout     His Past Surgical History Is Significant For: Past Surgical History:  Procedure Laterality Date  . BUBBLE STUDY  09/27/2019   Procedure: BUBBLE STUDY;  Surgeon: Jodelle Red, MD;  Location: [facility] ENDOSCOPY;  Service:  Cardiovascular;;  . PATENT FORAMEN OVALE(PFO) CLOSURE N/A 10/05/2019   Procedure: PATENT FORAMEN OVALE (PFO) CLOSURE;  Surgeon: Tonny Bollman, MD;  Location: [facility] INVASIVE CV LAB;  Service: Cardiovascular;  Laterality: N/A;  . TEE WITHOUT CARDIOVERSION N/A 09/27/2019   Procedure: TRANSESOPHAGEAL ECHOCARDIOGRAM (TEE);  Surgeon: Jodelle Red, MD;  Location: [facility] ENDOSCOPY;  Service: Cardiovascular;  Laterality: N/A;    His Family History Is Significant For: Family History  Problem Relation Age of Onset  . Heart murmur Mother   . Benign prostatic hyperplasia Father   . Other Father        knee replacement  . CAD Maternal Grandfather        CABG X4  . Atrial fibrillation Maternal Grandfather        Had pacemaker    His Social History Is Significant For: Social History   Socioeconomic History  . Marital status: Married    Spouse name: Not on file  . Number of children: 1  . Years of education: Not on file  . Highest education level: Not on file  Occupational History  . Occupation: manages a Chief Technology Officer  Tobacco Use  . Smoking status: Never Smoker  . Smokeless tobacco: Never Used  Substance and Sexual Activity  . Alcohol use: Yes    Alcohol/week: 2.0 standard drinks    Types: 2 Cans of beer per week    Comment:  social  . Drug use: Never  . Sexual activity: Not on file  Other Topics Concern  . Not on file  Social History Narrative  . Not on file   Social Determinants of Health   Financial Resource Strain:   . Difficulty of Paying Living Expenses: Not on file  Food Insecurity:   . Worried About Programme researcher, broadcasting/film/video in the Last Year: Not on file  . Ran Out of Food in the Last Year: Not on file  Transportation Needs:   . Lack of Transportation (Medical): Not on file  . Lack of Transportation (Non-Medical): Not on file  Physical Activity:   . Days of Exercise per Week: Not on file  . Minutes of Exercise per Session: Not on file  Stress:   . Feeling  of Stress : Not on file  Social Connections:   . Frequency of Communication with Friends and Family: Not on file  . Frequency of Social Gatherings with Friends and Family: Not on file  . Attends Religious Services: Not on file  . Active Member of Clubs or Organizations: Not on file  . Attends Banker Meetings: Not on file  . Marital Status: Not on file    His Allergies Are:  Allergies  Allergen Reactions  . Penicillins     Did it involve swelling of the face/tongue/throat, SOB, or low BP? Unknown Did it involve sudden or severe rash/hives, skin peeling, or any reaction on the inside of your mouth or nose? Unknown Did you need to seek medical attention at a hospital or doctor's office? Unknown When did it last happen?childhood If all above answers are "NO", may proceed with cephalosporin use.   :   His Current Medications Are:  Outpatient Encounter Medications as of 12/12/2019  Medication Sig  . allopurinol (ZYLOPRIM) 300 MG tablet Take 300 mg by mouth daily.  [name] aspirin EC 81 MG tablet Take 1 tablet (81 mg total) by mouth daily.  [name] atorvastatin (LIPITOR) 40 MG tablet Take 1 tablet (40 mg total) by mouth at bedtime.  . clindamycin (CLEOCIN) 300 MG capsule Take 2 capsules (600 mg total) by mouth as directed. Take 2 capsules 1 hour prior to dental work, including cleanings for 6 months  . clopidogrel (PLAVIX) 75 MG tablet Take 1 tablet (75 mg total) by mouth daily.  . Multiple Vitamins-Minerals (MULTIVITAMIN WITH MINERALS) tablet Take 1 tablet by mouth daily.  . Omega-3 Fatty Acids (FISH OIL) 1000 MG CAPS Take 2,000 mg by mouth daily.   No facility-administered encounter medications on file as of 12/12/2019.  :  Review of Systems:  Out of a complete 14 point review of systems, all are reviewed and negative with the exception of these symptoms as listed below:  .Review of Systems  Constitutional: Negative.   HENT: Negative.   Eyes: Negative.   Respiratory:  Negative.   Cardiovascular: Negative.   Gastrointestinal: Negative.   Endocrine: Negative.   Genitourinary: Negative.   Musculoskeletal: Negative.   Skin: Negative.   Allergic/Immunologic: Negative.   Hematological: Negative.   Psychiatric/Behavioral: Negative.     Objective:  Neurological Exam  Physical Exam Physical Examination:   Vitals:   12/12/19 1520  BP: 119/79  Pulse: (!) 55  Temp: 97.8 F (36.6 C)   General Examination: The patient is a very pleasant 39 y.o. male in no acute distress. He appears well-developed and well-nourished and well groomed.   HEENT: Normocephalic, atraumatic, pupils are equal, round and reactive to light,  extraocular tracking is good without limitation to gaze excursion or nystagmus noted. Hearing is grossly intact. Face is symmetric with normal facial animation. Speech is clear with no dysarthria noted. There is no hypophonia. There is no lip, neck/head, jaw or voice tremor. Neck is supple with full range of passive and active motion. There are no carotid bruits on auscultation. Oropharynx exam reveals: mild mouth dryness, adequate dental hygiene and mild airway crowding, due to Smaller airway entry, Mallampati class II, tonsils are 1+, neck circumference 17-5/8 inches.  Tongue protrudes centrally in palate elevates symmetrically.  He has a minimal overbite.   Chest: Clear to auscultation without wheezing, rhonchi or crackles noted.  Heart: S1+S2+0, regular , Slightly bradycardic, 2 out of 6 systolic murmur noted.  Abdomen: Soft, non-tender and non-distended with normal bowel sounds appreciated on auscultation.  Extremities: There is no obv. Abn.   Skin: Warm and dry without trophic changes noted.   Musculoskeletal: exam reveals no obvious joint deformities, tenderness or joint swelling or erythema.   Neurologically:  Mental status: The patient is awake, alert and oriented in all 4 spheres. His immediate and remote memory, attention,  language skills and fund of knowledge are appropriate. There is no evidence of aphasia, agnosia, apraxia or anomia. Speech is clear with normal prosody and enunciation. Thought process is linear. Mood is normal and affect is normal.  Cranial nerves II - XII are as described above under HEENT exam.  Motor exam: Normal bulk, strength and tone is noted. There is no tremor, Romberg is negative. Fine motor skills and coordination: grossly intact.  Cerebellar testing: No dysmetria or intention tremor. There is no truncal or gait ataxia.  Sensory exam: intact to light touch in the upper and lower extremities.  Gait, station and balance: He stands easily. No veering to one side is noted. No leaning to one side is noted. Posture is age-appropriate and stance is narrow based. Gait shows normal stride length and normal pace. No problems turning are noted. Tandem walk is Challenging initially but to doable.  Assessment and Plan:  In summary, Cullan Launer is a very pleasant 39 y.o.-year old male with an underlying medical history of thoracic aneurysm, status post PAF closure, stroke, gout, and obesity, whose history and physical exam are concerning for obstructive sleep apnea (OSA). I had a long chat with the patient about my findings and the diagnosis of OSA, its prognosis and treatment options. We talked about medical treatments, surgical interventions and non-pharmacological approaches. I explained in particular the risks and ramifications of untreated moderate to severe OSA, especially with respect to developing cardiovascular disease down the Road, including congestive heart failure, difficult to treat hypertension, cardiac arrhythmias, or stroke. Even type 2 diabetes has, in part, been linked to untreated OSA. Symptoms of untreated OSA include daytime sleepiness, memory problems, mood irritability and mood disorder such as depression and anxiety, lack of energy, as well as recurrent headaches, especially  morning headaches. We talked about trying to maintain a healthy lifestyle in general, as well as the importance of weight control. We also talked about the importance of good sleep hygiene. I recommended the following at this time: sleep study.  I explained the sleep test procedure to the patient and also outlined possible surgical and non-surgical treatment options of OSA, including the use of a custom-made dental device (which would require a referral to a specialist dentist or oral surgeon), upper airway surgical options, such as traditional UPPP or a novel less invasive surgical option  in the form of Inspire hypoglossal nerve stimulation (which would involve a referral to an ENT surgeon). I also explained the CPAP treatment option to the patient, who indicated that he would be willing to try CPAP if the need arises. I explained the importance of being compliant with PAP treatment, not only for insurance purposes but primarily to improve His symptoms, and for the patient's long term health benefit, including to reduce His cardiovascular risks. I answered all his questions today and the patient was in agreement. I plan to see him back after the sleep study is completed and encouraged him to call with any interim questions, concerns, problems or updates.   Thank you very much for allowing me to participate in the care of this nice patient. If I can be of any further assistance to you please do not hesitate to talk to me.  Sincerely,   Star Age, MD, PhD

## 2019-12-13 ENCOUNTER — Telehealth: Payer: Self-pay

## 2019-12-13 LAB — BASIC METABOLIC PANEL
BUN/Creatinine Ratio: 13 (ref 9–20)
BUN: 9 mg/dL (ref 6–24)
CO2: 27 mmol/L (ref 20–29)
Calcium: 9.6 mg/dL (ref 8.7–10.2)
Chloride: 103 mmol/L (ref 96–106)
Creatinine, Ser: 0.71 mg/dL — ABNORMAL LOW (ref 0.76–1.27)
GFR calc Af Amer: 136 mL/min/{1.73_m2} (ref >59)
GFR calc non Af Amer: 117 mL/min/{1.73_m2} (ref >59)
Glucose: 94 mg/dL (ref 65–99)
Potassium: 4.1 mmol/L (ref 3.5–5.2)
Sodium: 142 mmol/L (ref 134–144)

## 2019-12-13 NOTE — Telephone Encounter (Signed)
The patient has been notified of the lab results and verbalized understanding.  All questions (if any) were answered. Sigurd Sos, RN 12/13/2019 11:31 AM

## 2019-12-13 NOTE — Telephone Encounter (Signed)
-----   Message from Janetta Hora, PA-C sent at 12/13/2019 10:53 AM EST ----- Labs for CT are stable

## 2019-12-17 ENCOUNTER — Ambulatory Visit (INDEPENDENT_AMBULATORY_CARE_PROVIDER_SITE_OTHER)
Admission: RE | Admit: 2019-12-17 | Discharge: 2019-12-17 | Disposition: A | Discharge status: Home / Self Care | Payer: 59 | Source: Ambulatory Visit | Attending: Cardiovascular Disease | Admitting: Cardiovascular Disease

## 2019-12-17 ENCOUNTER — Other Ambulatory Visit: Payer: Self-pay

## 2019-12-17 DIAGNOSIS — I712 Thoracic aortic aneurysm, without rupture, unspecified: Secondary | ICD-10-CM

## 2019-12-17 MED ORDER — IOHEXOL 350 MG/ML SOLN
100.0000 mL | Freq: Once | INTRAVENOUS | Status: AC | PRN
  Administered 2019-12-17: 09:00:00 100 mL via INTRAVENOUS

## 2019-12-20 NOTE — Progress Notes (Signed)
Chief Complaint  Patient presents with  . Follow-up    thoracic aortic aneurysm   History of Present Illness: 39 yo male with history of PFO s/p closure, prior CVA who is here today for cardiac follow up. He was seen in my office in November 2019 for evaluation of a cardiac murmur. Echo December 2019 with normal LV systolic function and no significant valve disease. He is known to have a thoracic aortic aneurysm, 4.2 cm by CTA chest in January of 2020 and 2021. He was seen in the ED October 2020 with stroke like symptoms. MRI showed a small acute infarction in the right corona radiata and small old infarctions in the left putamen and old infarction in the left cerebellum. Echo with bubble study suggested a small PFO. Dopplers were negative for DVT. He was started on an ASA and statin. Hypercoagulable workup negative. TEE 09/27/19 with functionally bicuspid aortic valve with moderate to large PFO. He underwent PFO closure 10/05/19. Echo 10/05/19 with normal LV function and no atrial level shunt. He was discharged on ASA and Plavix.   He is here today for follow up. The patient denies any chest pain, dyspnea, palpitations, lower extremity edema, orthopnea, PND, dizziness, near syncope or syncope. He feels great.   Primary Care Physician: [facility] Lucita Lora, NP)  Past Medical History:  Diagnosis Date  . Cardiac murmur, unspecified   . Gout    Past Surgical History:  No surgeries  Current Outpatient Medications  Medication Sig Dispense Refill  . allopurinol (ZYLOPRIM) 300 MG tablet Take 300 mg by mouth daily.    [name] aspirin EC 81 MG tablet Take 1 tablet (81 mg total) by mouth daily. 90 tablet 3  . atorvastatin (LIPITOR) 40 MG tablet Take 1 tablet (40 mg total) by mouth at bedtime. 90 tablet 3  . clindamycin (CLEOCIN) 300 MG capsule Take 2 capsules (600 mg total) by mouth as directed. Take 2 capsules 1 hour prior to dental work, including cleanings for 6 months 8 capsule  0  . clopidogrel (PLAVIX) 75 MG tablet Take 1 tablet (75 mg total) by mouth daily. 90 tablet 0  . Multiple Vitamins-Minerals (MULTIVITAMIN WITH MINERALS) tablet Take 1 tablet by mouth daily.    . Omega-3 Fatty Acids (FISH OIL) 1000 MG CAPS Take 2,000 mg by mouth daily.     No current facility-administered medications for this visit.    Allergies  Allergen Reactions  . Penicillins Other (See Comments)    Did it involve swelling of the face/tongue/throat, SOB, or low BP? Unknown Did it involve sudden or severe rash/hives, skin peeling, or any reaction on the inside of your mouth or nose? Unknown Did you need to seek medical attention at a hospital or doctor's office? Unknown When did it last happen?childhood If all above answers are "NO", may proceed with cephalosporin use.     Social History   Socioeconomic History  . Marital status: Married    Spouse name: Not on file  . Number of children: 1  . Years of education: Not on file  . Highest education level: Not on file  Occupational History  . Occupation: manages a Scientist, clinical (histocompatibility and immunogenetics)  Tobacco Use  . Smoking status: Never Smoker  . Smokeless tobacco: Never Used  Substance and Sexual Activity  . Alcohol use: Yes    Alcohol/week: 2.0 standard drinks    Types: 2 Cans of beer per week    Comment: social  . Drug use:  Never  . Sexual activity: Not on file  Other Topics Concern  . Not on file  Social History Narrative  . Not on file   Social Determinants of Health   Financial Resource Strain:   . Difficulty of Paying Living Expenses: Not on file  Food Insecurity:   . Worried About Programme researcher, broadcasting/film/video in the Last Year: Not on file  . Ran Out of Food in the Last Year: Not on file  Transportation Needs:   . Lack of Transportation (Medical): Not on file  . Lack of Transportation (Non-Medical): Not on file  Physical Activity:   . Days of Exercise per Week: Not on file  . Minutes of Exercise per Session: Not on file   Stress:   . Feeling of Stress : Not on file  Social Connections:   . Frequency of Communication with Friends and Family: Not on file  . Frequency of Social Gatherings with Friends and Family: Not on file  . Attends Religious Services: Not on file  . Active Member of Clubs or Organizations: Not on file  . Attends Banker Meetings: Not on file  . Marital Status: Not on file  Intimate Partner Violence:   . Fear of Current or Ex-Partner: Not on file  . Emotionally Abused: Not on file  . Physically Abused: Not on file  . Sexually Abused: Not on file    Family History  Problem Relation Age of Onset  . Heart murmur Mother   . Benign prostatic hyperplasia Father   . Other Father        knee replacement  . CAD Maternal Grandfather        CABG X4  . Atrial fibrillation Maternal Grandfather        Had pacemaker    Review of Systems:  As stated in the HPI and otherwise negative.   BP (!) 122/58   Pulse 61   Ht 6\' 1"  (1.854 m)   Wt 266 lb 12.8 oz (121 kg)   SpO2 97%   BMI 35.20 kg/m   Physical Examination: General: Well developed, well nourished, NAD  HEENT: OP clear, mucus membranes moist  SKIN: warm, dry. No rashes. Neuro: No focal deficits  Musculoskeletal: Muscle strength 5/5 all ext  Psychiatric: Mood and affect normal  Neck: No JVD, no carotid bruits, no thyromegaly, no lymphadenopathy.  Lungs:Clear bilaterally, no wheezes, rhonci, crackles Cardiovascular: Regular rate and rhythm. No murmurs, gallops or rubs. Abdomen:Soft. Bowel sounds present. Non-tender.  Extremities: No lower extremity edema. Pulses are 2 + in the bilateral DP/PT.  EKG:  EKG is not ordered today. The ekg ordered today demonstrates   Recent Labs: 09/21/2019: Hemoglobin 16.4; Platelets 274 12/12/2019: BUN 9; Creatinine, Ser 0.71; Potassium 4.1; Sodium 142   Lipid Panel No results found for: CHOL, TRIG, HDL, CHOLHDL, VLDL, LDLCALC, LDLDIRECT   Wt Readings from Last 3 Encounters:   12/21/19 266 lb 12.8 oz (121 kg)  12/12/19 271 lb 8 oz (123.2 kg)  11/15/19 268 lb 12.8 oz (121.9 kg)     Other studies Reviewed: Additional studies/ records that were reviewed today include:  Review of the above records demonstrates:    Assessment and Plan:   1. Patent foramen ovale: He is s/p PFO closure October 2020. He has been on ASA and Plavix. Will stop Plavix 90 days post procedure.    2. Functionally bicuspid aortic valve with thoracic aortic aneurysm: 4.2 cm thoracic aortic aneurysm by chest CTA January 2021. Repeat  CTA in January 2022. No murmur on exam. His aortic valve is functionally bicuspid but opening well. Will repeat echo October 2022.  Current medicines are reviewed at length with the patient today.  The patient does not have concerns regarding medicines.  The following changes have been made:  no change  Labs/ tests ordered today include:   Orders Placed This Encounter  Procedures  . CT ANGIO CHEST AORTA W/CM & OR WO/CM     Disposition:   FU with me in 12 months   Signed, Verne Carrow, MD 12/21/2019 9:39 AM    [facility] [address] [place], [place], [place]  [address] Phone: [phone]; Fax: [phone]

## 2019-12-21 ENCOUNTER — Ambulatory Visit (INDEPENDENT_AMBULATORY_CARE_PROVIDER_SITE_OTHER): Payer: 59 | Admitting: Cardiovascular Disease

## 2019-12-21 ENCOUNTER — Encounter: Payer: Self-pay | Admitting: Cardiovascular Disease

## 2019-12-21 ENCOUNTER — Other Ambulatory Visit: Payer: Self-pay

## 2019-12-21 VITALS — BP 122/58 | HR 61 | Ht 73.0 in | Wt 266.8 lb

## 2019-12-21 DIAGNOSIS — I712 Thoracic aortic aneurysm, without rupture, unspecified: Secondary | ICD-10-CM

## 2019-12-21 DIAGNOSIS — Q211 Atrial septal defect: Secondary | ICD-10-CM

## 2019-12-21 DIAGNOSIS — Q231 Congenital insufficiency of aortic valve: Secondary | ICD-10-CM

## 2019-12-21 DIAGNOSIS — Q2112 Patent foramen ovale: Secondary | ICD-10-CM

## 2019-12-21 NOTE — Patient Instructions (Signed)
Medication Instructions:  No changes *If you need a refill on your cardiac medications before your next appointment, please call your pharmacy*  Lab Work: none If you have labs (blood work) drawn today and your tests are completely normal, you will receive your results only by: [name] MyChart Message (if you have MyChart) OR . A paper copy in the mail If you have any lab test that is abnormal or we need to change your treatment, we will call you to review the results.  Testing/Procedures: Repeat chest CT in 1 year (Jan 2022) prior to next visit with Dr. Clifton James.  Follow-Up: At [facility], you and your health needs are our priority.  As part of our continuing mission to provide you with exceptional heart care, we have created designated Provider Care Teams.  These Care Teams include your primary Cardiologist (physician) and Advanced Practice Providers (APPs -  Physician Assistants and Nurse Practitioners) who all work together to provide you with the care you need, when you need it.  Your next appointment:   12 month(s)  The format for your next appointment:   Either In Person or Virtual  Provider:   You may see Verne Carrow, MD or one of the following Advanced Practice Providers on your designated Care Team:    Ronie Spies, PA-C  Jacolyn Reedy, PA-C   Other Instructions

## 2019-12-29 ENCOUNTER — Other Ambulatory Visit: Payer: Self-pay | Admitting: Cardiovascular Disease

## 2020-02-14 ENCOUNTER — Encounter: Payer: Self-pay | Admitting: Adult Health

## 2020-02-18 ENCOUNTER — Encounter: Payer: Self-pay | Admitting: Adult Health

## 2020-02-18 ENCOUNTER — Other Ambulatory Visit: Payer: Self-pay

## 2020-02-18 ENCOUNTER — Ambulatory Visit (INDEPENDENT_AMBULATORY_CARE_PROVIDER_SITE_OTHER): Payer: 59 | Admitting: Adult Health

## 2020-02-18 VITALS — BP 128/80 | HR 61 | Temp 97.5°F | Ht 73.0 in | Wt 268.0 lb

## 2020-02-18 DIAGNOSIS — E785 Hyperlipidemia, unspecified: Secondary | ICD-10-CM

## 2020-02-18 DIAGNOSIS — Z8774 Personal history of (corrected) congenital malformations of heart and circulatory system: Secondary | ICD-10-CM | POA: Diagnosis not present

## 2020-02-18 DIAGNOSIS — I639 Cerebral infarction, unspecified: Secondary | ICD-10-CM | POA: Diagnosis not present

## 2020-02-18 NOTE — Progress Notes (Signed)
[facility] [address] [place]. [place] [address] [phone]       STROKE FOLLOW UP NOTE  Adam. Adam Mayer Date of Birth:  [birth year] Medical Record Number:  [id number]   Reason for Referral: stroke follow up    CHIEF COMPLAINT:  Chief Complaint  Patient presents with  . Follow-up    Stoke follow up room 9 pt had PFO closure done since last visit pt alone    HPI:  Adam Mayer is a 39 year old male who is being seen today, 02/18/2020, for stroke follow-up.  He has been stable from a stroke standpoint without new or reoccurring stroke/TIA symptoms.  Continues on aspirin and atorvastatin for secondary stroke prevention without side effects.  He completed recommended 52-month course of DAPT post PFO closure.  Blood pressure today satisfactory at 128/80.  He does report occasional/mild short-term memory concerns but does endorse stressful job working long hours.  No other memory or cognitive concerns.  He was evaluated by [facility] sleep clinic for possible atrial fibrillation but has not proceeded with sleep study as he reports insurance will not cover.  Denies snoring, daytime fatigue, insomnia or witnessed apneic events.  No concerns at this time.     History copied for reference purposes only Initial visit via virtual visit 11/20/2019 PS: Adam Mayer is a pleasant 39 year old Caucasian male seen today for initial office virtual video consultation visit for stroke.  History is obtained from the patient, review of electronic medical records, care everywhere and imaging results.  Actual imaging films could not be reviewed personally.  He is obese middle-age Caucasian male who developed an episode of sudden onset of left body numbness on 09/13/2019.  This lasted around 12 hours and recovered completely.  He was admitted to [facility] in [place] where CT scan of the head was unremarkable.  MRI scan of the brain showed no acute 1.2 cm right perirolandic white  matter infarct.  There are old infarcts noted in the left putamen, basal ganglia as well as left cerebellum as well.  MRI of the brain showed no large vessel stenosis or occlusion.  CT angiogram of the brain and neck were also obtained which showed no large vessel stenosis either.  Transthoracic echo was unremarkable except for presence of right to left shunt after agitated saline injection..  TEE on 09/27/2019 showed normal ejection fraction but moderate size patent foramen ovale with bidirectional shunting across the atrial septum.  Lab results for lipid profile and A1c are not available.  Hypercoagulable panel labs were all unremarkable.  Patient was started on aspirin.  His ROPE score was six with only a 60% chance of his strokes being attributable to PFO but he was referred for endovascular closure.  He was referred to Dr. Tonny Bollman interventional cardiologist who confirmed the PFO by TEE and patient underwent elective endovascular PFO closure on 10/05/2019 uneventfully.  Postprocedure TEE showed no residual shunt.  Patient is currently on aspirin and Plavix and tolerating both medications well without any bruising or bleeding.  Is also on a statin as well as blood pressure medications.  Is tolerating his medications well without any significant side effects.  He states left-sided paresthesias have not recurred and has had no further recurrent stroke or TIA symptoms.  The patient has never been evaluated for sleep apnea.  He has no complaints today.  He has no prior history of strokes, TIAs, deep vein thrombosis pulmonary embolism or family history of strokes or TIAs in a  young age.     ROS:   14 system review of systems performed and negative with exception of short-term memory concerns  PMH:  Past Medical History:  Diagnosis Date  . Cardiac murmur, unspecified   . Gout   . Stroke [facility])     PSH:  Past Surgical History:  Procedure Laterality Date  . BUBBLE STUDY  09/27/2019   Procedure:  BUBBLE STUDY;  Surgeon: Jodelle Red, MD;  Location: [facility] ENDOSCOPY;  Service: Cardiovascular;;  . PATENT FORAMEN OVALE(PFO) CLOSURE N/A 10/05/2019   Procedure: PATENT FORAMEN OVALE (PFO) CLOSURE;  Surgeon: Tonny Bollman, MD;  Location: [facility] INVASIVE CV LAB;  Service: Cardiovascular;  Laterality: N/A;  . TEE WITHOUT CARDIOVERSION N/A 09/27/2019   Procedure: TRANSESOPHAGEAL ECHOCARDIOGRAM (TEE);  Surgeon: Jodelle Red, MD;  Location: [facility] ENDOSCOPY;  Service: Cardiovascular;  Laterality: N/A;    Social History:  Social History   Socioeconomic History  . Marital status: Married    Spouse name: Not on file  . Number of children: 1  . Years of education: Not on file  . Highest education level: Not on file  Occupational History  . Occupation: manages a Chief Technology Officer  Tobacco Use  . Smoking status: Never Smoker  . Smokeless tobacco: Never Used  Substance and Sexual Activity  . Alcohol use: Yes    Alcohol/week: 2.0 standard drinks    Types: 2 Cans of beer per week    Comment: social  . Drug use: Never  . Sexual activity: Not on file  Other Topics Concern  . Not on file  Social History Narrative  . Not on file   Social Determinants of Health   Financial Resource Strain:   . Difficulty of Paying Living Expenses:   Food Insecurity:   . Worried About Programme researcher, broadcasting/film/video in the Last Year:   . Barista in the Last Year:   Transportation Needs:   . Freight forwarder (Medical):   [name] Lack of Transportation (Non-Medical):   Physical Activity:   . Days of Exercise per Week:   . Minutes of Exercise per Session:   Stress:   . Feeling of Stress :   Social Connections:   . Frequency of Communication with Friends and Family:   . Frequency of Social Gatherings with Friends and Family:   . Attends Religious Services:   . Active Member of Clubs or Organizations:   . Attends Banker Meetings:   [name] Marital Status:   Intimate Partner  Violence:   . Fear of Current or Ex-Partner:   . Emotionally Abused:   [name] Physically Abused:   . Sexually Abused:     Family History:  Family History  Problem Relation Age of Onset  . Heart murmur Mother   . Benign prostatic hyperplasia Father   . Other Father        knee replacement  . CAD Maternal Grandfather        CABG X4  . Atrial fibrillation Maternal Grandfather        Had pacemaker    Medications:   Current Outpatient Medications on File Prior to Visit  Medication Sig Dispense Refill  . allopurinol (ZYLOPRIM) 300 MG tablet Take 300 mg by mouth daily.    [name] aspirin EC 81 MG tablet Take 1 tablet (81 mg total) by mouth daily. 90 tablet 3  . atorvastatin (LIPITOR) 40 MG tablet Take 1 tablet (40 mg total) by mouth at bedtime. 90 tablet 3  .  clindamycin (CLEOCIN) 300 MG capsule Take 2 capsules (600 mg total) by mouth as directed. Take 2 capsules 1 hour prior to dental work, including cleanings for 6 months 8 capsule 0  . Multiple Vitamins-Minerals (MULTIVITAMIN WITH MINERALS) tablet Take 1 tablet by mouth daily.    . Omega-3 Fatty Acids (FISH OIL) 1000 MG CAPS Take 2,000 mg by mouth daily.     No current facility-administered medications on file prior to visit.    Allergies:   Allergies  Allergen Reactions  . Penicillins Other (See Comments)    Did it involve swelling of the face/tongue/throat, SOB, or low BP? Unknown Did it involve sudden or severe rash/hives, skin peeling, or any reaction on the inside of your mouth or nose? Unknown Did you need to seek medical attention at a hospital or doctor's office? Unknown When did it last happen?childhood If all above answers are "NO", may proceed with cephalosporin use.      Physical Exam  Vitals:   02/18/20 1521  BP: 128/80  Pulse: 61  Temp: (!) 97.5 F (36.4 C)  Weight: 268 lb (121.6 kg)  Height: 6\' 1"  (1.854 m)   Body mass index is 35.36 kg/m. No exam data present  No flowsheet data found.   General:  well developed, well nourished,  pleasant middle-age Caucasian male, seated, in no evident distress Head: head normocephalic and atraumatic.   Neck: supple with no carotid or supraclavicular bruits Cardiovascular: regular rate and rhythm, no murmurs Musculoskeletal: no deformity Skin:  no rash/petichiae Vascular:  Normal pulses all extremities   Neurologic Exam Mental Status: Awake and fully alert.   Normal speech and language.  Oriented to place and time. Recent and remote memory intact. Attention span, concentration and fund of knowledge appropriate. Mood and affect appropriate.  Cranial Nerves: Pupils equal, briskly reactive to light. Extraocular movements full without nystagmus. Visual fields full to confrontation. Hearing intact. Facial sensation intact. Face, tongue, palate moves normally and symmetrically.  Motor: Normal bulk and tone. Normal strength in all tested extremity muscles. Sensory.: intact to touch , pinprick , position and vibratory sensation.  Coordination: Rapid alternating movements normal in all extremities. Finger-to-nose and heel-to-shin performed accurately bilaterally. Gait and Station: Arises from chair without difficulty. Stance is normal. Gait demonstrates normal stride length and balance Reflexes: 1+ and symmetric. Toes downgoing.         ASSESSMENT: 39 year old Caucasian male with recent right subcortical infarct likely from small vessel disease but brain imaging shows multiple old infarcts involving left basal ganglia and left cerebellum raising possibility of cryptogenic etiology.  Evidence of PFO and underwent closure by Dr. Burt Knack on 10/05/2019. Vascular risk factors of PFO s/p closure, obesity and hyperlipidemia.  Stable from a stroke standpoint without residual deficits.     PLAN:  1. Right subcortical stroke: Continue aspirin 81 mg daily  and atorvastatin for secondary stroke prevention. Maintain strict control of hypertension with blood pressure  goal below 130/90, diabetes with hemoglobin A1c goal below 6.5% and cholesterol with LDL cholesterol (bad cholesterol) goal below 70 mg/dL.  I also advised the patient to eat a healthy diet with plenty of whole grains, cereals, fruits and vegetables, exercise regularly with at least 30 minutes of continuous activity daily and maintain ideal body weight. 2. PFO s/p closure: Completed recommended 32-month DAPT duration advised to continue on aspirin alone.  Continue to follow cardiology as recommended 3. HLD: Advised to continue current treatment regimen along with continued follow-up with PCP for future prescribing and  monitoring of lipid panel 4. Short-term memory concerns: Possibly vascular versus life stressors.  Advised importance of stress reduction techniques and ensure adequate management of risk factors with weight loss and management of HLD.  Advised to follow-up with PCP with any concerns of worsening 5. Prefers to hold off on additional sleep apnea testing due to financial reasons as insurance will not cover. Stop bang score 2 showing low risk for possible sleep apnea.  He is aware to call office in the future if interested in proceeding.    Overall stable from stroke standpoint recommend follow-up as needed  I spent 22 minutes of face-to-face and non-face-to-face time with patient.  This included previsit chart review, lab review, study review, order entry, electronic health record documentation, patient education     Adam Mayer, [facility]  [facility] [address] [place], [place] [address]  Phone [phone] Fax [phone] Note: This document was prepared with digital dictation and possible smart phrase technology. Any transcriptional errors that result from this process are unintentional.

## 2020-02-18 NOTE — Patient Instructions (Signed)
Continue aspirin 81 mg daily  and atorvastatin for secondary stroke prevention  Continue to follow up with PCP regarding cholesterol and blood pressure management   Follow-up with cardiology as recommended for surveillance monitoring of PFO closure  Continue to monitor blood pressure at home  Maintain strict control of hypertension with blood pressure goal below 130/90, diabetes with hemoglobin A1c goal below 6.5% and cholesterol with LDL cholesterol (bad cholesterol) goal below 70 mg/dL. I also advised the patient to eat a healthy diet with plenty of whole grains, cereals, fruits and vegetables, exercise regularly and maintain ideal body weight.         Thank you for coming to see [place] at [facility]. I hope we have been able to provide you high quality care today.  You may receive a patient satisfaction survey over the next few weeks. We would appreciate your feedback and comments so that we may continue to improve ourselves and the health of our patients.

## 2020-03-07 NOTE — Progress Notes (Signed)
I agree with the above plan 

## 2020-03-25 ENCOUNTER — Other Ambulatory Visit: Payer: Self-pay

## 2020-03-25 ENCOUNTER — Observation Stay (HOSPITAL_COMMUNITY)
Admission: EM | Admit: 2020-03-25 | Discharge: 2020-03-26 | Disposition: A | Discharge status: Home / Self Care | Payer: 59 | Attending: Internal Medicine | Admitting: Internal Medicine

## 2020-03-25 ENCOUNTER — Telehealth: Payer: Self-pay | Admitting: Cardiovascular Disease

## 2020-03-25 ENCOUNTER — Encounter (HOSPITAL_COMMUNITY): Payer: Self-pay | Admitting: Emergency Medicine

## 2020-03-25 ENCOUNTER — Emergency Department (HOSPITAL_COMMUNITY): Payer: 59

## 2020-03-25 DIAGNOSIS — Q231 Congenital insufficiency of aortic valve: Secondary | ICD-10-CM | POA: Diagnosis not present

## 2020-03-25 DIAGNOSIS — I712 Thoracic aortic aneurysm, without rupture, unspecified: Secondary | ICD-10-CM | POA: Diagnosis present

## 2020-03-25 DIAGNOSIS — Z20822 Contact with and (suspected) exposure to covid-19: Secondary | ICD-10-CM | POA: Insufficient documentation

## 2020-03-25 DIAGNOSIS — R0789 Other chest pain: Secondary | ICD-10-CM | POA: Diagnosis not present

## 2020-03-25 DIAGNOSIS — Z8673 Personal history of transient ischemic attack (TIA), and cerebral infarction without residual deficits: Secondary | ICD-10-CM | POA: Insufficient documentation

## 2020-03-25 DIAGNOSIS — Z880 Allergy status to penicillin: Secondary | ICD-10-CM | POA: Diagnosis not present

## 2020-03-25 DIAGNOSIS — R001 Bradycardia, unspecified: Secondary | ICD-10-CM | POA: Diagnosis not present

## 2020-03-25 DIAGNOSIS — Z79899 Other long term (current) drug therapy: Secondary | ICD-10-CM | POA: Diagnosis not present

## 2020-03-25 DIAGNOSIS — Z7982 Long term (current) use of aspirin: Secondary | ICD-10-CM | POA: Diagnosis not present

## 2020-03-25 DIAGNOSIS — I6340 Cerebral infarction due to embolism of unspecified cerebral artery: Secondary | ICD-10-CM | POA: Diagnosis present

## 2020-03-25 DIAGNOSIS — R079 Chest pain, unspecified: Secondary | ICD-10-CM

## 2020-03-25 DIAGNOSIS — M109 Gout, unspecified: Secondary | ICD-10-CM | POA: Insufficient documentation

## 2020-03-25 LAB — CBC
HCT: 45.3 % (ref 39.0–52.0)
Hemoglobin: 15.6 g/dL (ref 13.0–17.0)
MCH: 30.2 pg (ref 26.0–34.0)
MCHC: 34.4 g/dL (ref 30.0–36.0)
MCV: 87.8 fL (ref 80.0–100.0)
Platelets: 227 10*3/uL (ref 150–400)
RBC: 5.16 MIL/uL (ref 4.22–5.81)
RDW: 12.7 % (ref 11.5–15.5)
WBC: 7.8 10*3/uL (ref 4.0–10.5)
nRBC: 0 % (ref 0.0–0.2)

## 2020-03-25 LAB — TROPONIN I (HIGH SENSITIVITY)
Troponin I (High Sensitivity): 50 ng/L — ABNORMAL HIGH (ref <18)
Troponin I (High Sensitivity): 49 ng/L — ABNORMAL HIGH (ref <18)

## 2020-03-25 LAB — BASIC METABOLIC PANEL
Anion gap: 10 (ref 5–15)
BUN: 8 mg/dL (ref 6–20)
CO2: 27 mmol/L (ref 22–32)
Calcium: 9.4 mg/dL (ref 8.9–10.3)
Chloride: 103 mmol/L (ref 98–111)
Creatinine, Ser: 0.79 mg/dL (ref 0.61–1.24)
GFR calc Af Amer: >60 mL/min (ref >60)
GFR calc non Af Amer: >60 mL/min (ref >60)
Glucose, Bld: 119 mg/dL — ABNORMAL HIGH (ref 70–99)
Potassium: 3.8 mmol/L (ref 3.5–5.1)
Sodium: 140 mmol/L (ref 135–145)

## 2020-03-25 NOTE — ED Triage Notes (Signed)
Pt reports CP starting after lunch. States he just felt off since going in to work. Left sided discomfort.

## 2020-03-25 NOTE — Telephone Encounter (Signed)
Loa Socks, LPN at 9/60/4540 2:59 PM  Status: Signed    Pt calling in to make an appt today or tomorrow with Dr. Clifton James or an APP for complaints of feeling lightheaded upon waking this morning, and chest pain, one hour after eating lunch today. Pt states he is very stressed out with work, and is unsure if his complaints are coming from this.  Pt states he did take his BP when his chest pain occurred and it was 138/78.  Pt states this morning when he woke up, he felt lightheaded.  He said he had no blurred vision, pre-syncopal, or syncopal episodes.  Pt states he went to work and ate his lunch around noon, where he then experienced chest discomfort 1 hour after eating.  Pt states he is not sob, has no DOE, N/V, diaphoresis, weakness, blurred vision, or radiating pain. Pt states he had no pre-syncopal or syncopal episodes.  Scheduled the pt to come in and see Vin Bhagat PA-C for tomorrow 4/21 at 2:45 pm.  Pt aware to arrive 15 mins prior to this appt.  Advised the pt that if in the meantime his symptoms persist or worsen, then he should seek immediate medical attention.  Pt education provided on what warrants immediate medical attention.  Advised the pt to stay hydrated, maintain low sodium intake, and take his meds as prescribed.  Advised the pt to make sure to reduce any fried or fatty foods in his diet, for this can cause reflux issues after eating.  Advised the pt to make sure he is sitting up for an hour or so after eating, to allow appropriate digestion.  Advised him to avoid eating late tonight.   Informed the pt that Dr Clifton James and his RN are out of the office today, but I will route this message to them for their review, upon return to the office.  Pt verbalized understanding and agrees with this plan.       SEE TELEPHONE ENCOUNTER OPEN FROM TODAY IN REGARDS TO THIS SAME MESSAGE.

## 2020-03-25 NOTE — Telephone Encounter (Signed)
   Pt c/o of Chest Pain: STAT if CP now or developed within 24 hours  1. Are you having CP right now? Yes  2. Are you experiencing any other symptoms (ex. SOB, nausea, vomiting, sweating)? Lightheadedness   3. How long have you been experiencing CP? Today after lunch  4. Is your CP continuous or coming and going? continuous   5. Have you taken Nitroglycerin? No, don't have one  ?

## 2020-03-25 NOTE — Telephone Encounter (Signed)
Pt calling in to make an appt today or tomorrow with Dr. Clifton James or an APP for complaints of feeling lightheaded upon waking this morning, and chest pain, one hour after eating lunch today. Pt states he is very stressed out with work, and is unsure if his complaints are coming from this.  Pt states he did take his BP when his chest pain occurred and it was 138/78.  Pt states this morning when he woke up, he felt lightheaded.  He said he had no blurred vision, pre-syncopal, or syncopal episodes.  Pt states he went to work and ate his lunch around noon, where he then experienced chest discomfort 1 hour after eating.  Pt states he is not sob, has no DOE, N/V, diaphoresis, weakness, blurred vision, or radiating pain. Pt states he had no pre-syncopal or syncopal episodes.  Scheduled the pt to come in and see Vin Bhagat PA-C for tomorrow 4/21 at 2:45 pm.  Pt aware to arrive 15 mins prior to this appt.  Advised the pt that if in the meantime his symptoms persist or worsen, then he should seek immediate medical attention.  Pt education provided on what warrants immediate medical attention.  Advised the pt to stay hydrated, maintain low sodium intake, and take his meds as prescribed.  Advised the pt to make sure to reduce any fried or fatty foods in his diet, for this can cause reflux issues after eating.  Advised the pt to make sure he is sitting up for an hour or so after eating, to allow appropriate digestion.  Advised him to avoid eating late tonight.   Informed the pt that Dr Clifton James and his RN are out of the office today, but I will route this message to them for their review, upon return to the office.  Pt verbalized understanding and agrees with this plan.

## 2020-03-26 ENCOUNTER — Ambulatory Visit: Payer: PRIVATE HEALTH INSURANCE | Admitting: Physician Assistant

## 2020-03-26 ENCOUNTER — Emergency Department (HOSPITAL_COMMUNITY): Payer: 59

## 2020-03-26 ENCOUNTER — Observation Stay (HOSPITAL_BASED_OUTPATIENT_CLINIC_OR_DEPARTMENT_OTHER): Payer: 59

## 2020-03-26 ENCOUNTER — Encounter (HOSPITAL_COMMUNITY): Payer: Self-pay | Admitting: Radiology

## 2020-03-26 DIAGNOSIS — I712 Thoracic aortic aneurysm, without rupture, unspecified: Secondary | ICD-10-CM | POA: Diagnosis present

## 2020-03-26 DIAGNOSIS — I361 Nonrheumatic tricuspid (valve) insufficiency: Secondary | ICD-10-CM | POA: Diagnosis not present

## 2020-03-26 DIAGNOSIS — R001 Bradycardia, unspecified: Secondary | ICD-10-CM | POA: Diagnosis present

## 2020-03-26 DIAGNOSIS — R079 Chest pain, unspecified: Secondary | ICD-10-CM

## 2020-03-26 DIAGNOSIS — M109 Gout, unspecified: Secondary | ICD-10-CM | POA: Diagnosis present

## 2020-03-26 LAB — SARS CORONAVIRUS 2 (TAT 6-24 HRS): SARS Coronavirus 2: NEGATIVE

## 2020-03-26 LAB — ECHOCARDIOGRAM COMPLETE
Height: 73 in
Weight: 4179.2 oz

## 2020-03-26 LAB — TSH: TSH: 3.677 u[IU]/mL (ref 0.350–4.500)

## 2020-03-26 LAB — TROPONIN I (HIGH SENSITIVITY): Troponin I (High Sensitivity): 40 ng/L — ABNORMAL HIGH (ref <18)

## 2020-03-26 LAB — HIV ANTIBODY (ROUTINE TESTING W REFLEX): HIV Screen 4th Generation wRfx: NONREACTIVE

## 2020-03-26 MED ORDER — ENOXAPARIN SODIUM 40 MG/0.4ML ~~LOC~~ SOLN: 40.0000 mg | SUBCUTANEOUS | Status: DC

## 2020-03-26 MED ORDER — ACETAMINOPHEN 325 MG PO TABS: 650.0000 mg | ORAL_TABLET | ORAL | Status: DC | PRN

## 2020-03-26 MED ORDER — ALLOPURINOL 300 MG PO TABS
300.0000 mg | ORAL_TABLET | Freq: Every day | ORAL | Status: DC
  Administered 2020-03-26: 300 mg via ORAL
  Filled 2020-03-26: qty 1

## 2020-03-26 MED ORDER — ASPIRIN EC 81 MG PO TBEC
81.0000 mg | DELAYED_RELEASE_TABLET | Freq: Every day | ORAL | Status: DC
  Administered 2020-03-26: 81 mg via ORAL
  Filled 2020-03-26: qty 1

## 2020-03-26 MED ORDER — ONDANSETRON HCL 4 MG/2ML IJ SOLN: 4.0000 mg | Freq: Four times a day (QID) | INTRAMUSCULAR | Status: DC | PRN

## 2020-03-26 MED ORDER — IOHEXOL 350 MG/ML SOLN
100.0000 mL | Freq: Once | INTRAVENOUS | Status: AC | PRN
  Administered 2020-03-26: 100 mL via INTRAVENOUS

## 2020-03-26 MED ORDER — ASPIRIN 81 MG PO CHEW
324.0000 mg | CHEWABLE_TABLET | Freq: Once | ORAL | Status: AC
  Administered 2020-03-26: 01:00:00 324 mg via ORAL
  Filled 2020-03-26: qty 4

## 2020-03-26 MED ORDER — ADULT MULTIVITAMIN W/MINERALS CH
1.0000 | ORAL_TABLET | Freq: Every day | ORAL | Status: DC
  Administered 2020-03-26: 1 via ORAL
  Filled 2020-03-26: qty 1

## 2020-03-26 MED ORDER — ATORVASTATIN CALCIUM 40 MG PO TABS: 40.0000 mg | ORAL_TABLET | Freq: Every day | ORAL | Status: DC

## 2020-03-26 MED ORDER — ENOXAPARIN SODIUM 40 MG/0.4ML ~~LOC~~ SOLN
40.0000 mg | SUBCUTANEOUS | Status: DC
  Filled 2020-03-26: qty 0.4

## 2020-03-26 NOTE — ED Notes (Signed)
Family at bedside. 

## 2020-03-26 NOTE — H&P (Signed)
History and Physical    Rece Zechman MGQ:[id number] DOB: [birth year] DOA: 03/25/2020  PCP: [facility] Patient coming from: Home  Chief Complaint: Chest pain  HPI: Adam Mayer is a 39 y.o. male with medical history significant of functionally bicuspid aortic valve, thoracic aortic aneurysm, PFO status post closure, CVA, gout presenting with complaints of chest pain.  Patient states he works at a [employer].  This morning while at work he felt dizzy.  He did not have any chest pain at that time.  Later in the day while in his office at rest he experienced left-sided 5 out of 10 intensity nonradiating pressure-like chest pain.  No associated dyspnea, diaphoresis, or nausea.  His chest pain persisted for several hours and he eventually felt better but then upon ED arrival he started having chest pain again.  Currently his chest pain has resolved and he is feeling better.  Denies history of tobacco use.  States his maternal grandfather had heart bypass surgery.  Denies fevers, chills, or cough.  States his heart rate is normally in the 50s.  ED Course: Bradycardic with heart rate in the 40s to 50s, remainder of vital signs stable.  Initial high-sensitivity troponin 58, repeat flat at 49.  EKG without acute ischemic changes.  Chest x-ray showing no active cardiopulmonary disease. CT angiogram chest/abdomen/pelvis negative for PE.  Showing a 4.1 cm ascending thoracic aortic aneurysm compared to 4.3 cm previously.  No dissection.  No acute findings in the chest, abdomen, or pelvis.  ED provider discussed the case with Dr. Mayford Knife from cardiology who recommended admission for trending troponin and echo in the morning.  Patient received aspirin 324 mg.  Review of Systems:  All systems reviewed and apart from history of presenting illness, are negative.  Past Medical History:  Diagnosis Date  . Cardiac murmur, unspecified   . Gout   . Stroke [facility])     Past Surgical  History:  Procedure Laterality Date  . BUBBLE STUDY  09/27/2019   Procedure: BUBBLE STUDY;  Surgeon: Jodelle Red, MD;  Location: [facility] ENDOSCOPY;  Service: Cardiovascular;;  . PATENT FORAMEN OVALE(PFO) CLOSURE N/A 10/05/2019   Procedure: PATENT FORAMEN OVALE (PFO) CLOSURE;  Surgeon: Tonny Bollman, MD;  Location: [facility] INVASIVE CV LAB;  Service: Cardiovascular;  Laterality: N/A;  . TEE WITHOUT CARDIOVERSION N/A 09/27/2019   Procedure: TRANSESOPHAGEAL ECHOCARDIOGRAM (TEE);  Surgeon: Jodelle Red, MD;  Location: [facility] ENDOSCOPY;  Service: Cardiovascular;  Laterality: N/A;     reports that he has never smoked. He has never used smokeless tobacco. He reports current alcohol use of about 2.0 standard drinks of alcohol per week. He reports that he does not use drugs.  Allergies  Allergen Reactions  . Penicillins Other (See Comments)    Did it involve swelling of the face/tongue/throat, SOB, or low BP? Unknown Did it involve sudden or severe rash/hives, skin peeling, or any reaction on the inside of your mouth or nose? Unknown Did you need to seek medical attention at a hospital or doctor's office? Unknown When did it last happen?childhood If all above answers are "NO", may proceed with cephalosporin use.     Family History  Problem Relation Age of Onset  . Heart murmur Mother   . Benign prostatic hyperplasia Father   . Other Father        knee replacement  . CAD Maternal Grandfather        CABG X4  . Atrial fibrillation Maternal Grandfather  Had pacemaker    Prior to Admission medications   Medication Sig Start Date End Date Taking? Authorizing Provider  allopurinol (ZYLOPRIM) 300 MG tablet Take 300 mg by mouth daily.   Yes [provider]  aspirin EC 81 MG tablet Take 1 tablet (81 mg total) by mouth daily. 10/03/19  Yes Tonny Bollman, MD  atorvastatin (LIPITOR) 40 MG tablet Take 1 tablet (40 mg total) by mouth at bedtime. 09/21/19  Yes Berton Bon, NP  Multiple Vitamins-Minerals (MULTIVITAMIN WITH MINERALS) tablet Take 1 tablet by mouth daily.   Yes [provider]  Omega-3 Fatty Acids (FISH OIL) 1000 MG CAPS Take 2,000 mg by mouth daily.   Yes [provider]  clindamycin (CLEOCIN) 300 MG capsule Take 2 capsules (600 mg total) by mouth as directed. Take 2 capsules 1 hour prior to dental work, including cleanings for 6 months Patient not taking: Reported on 03/26/2020 10/05/19 04/04/20  Janetta Hora, PA-C    Physical Exam: Vitals:   03/26/20 0045 03/26/20 0100 03/26/20 0115 03/26/20 0130  BP: 131/82 120/82 110/70 111/72  Pulse: (!) 49 (!) 51 (!) 47 (!) 49  Resp: 10 19 16 17   Temp:      TempSrc:      SpO2: 97% 97% 99% 97%  Weight:      Height:        Physical Exam  Constitutional: He is oriented to person, place, and time. He appears well-developed and well-nourished. No distress.  HENT:  Head: Normocephalic.  Eyes: Right eye exhibits no discharge. Left eye exhibits no discharge.  Cardiovascular: Regular rhythm and intact distal pulses.  No murmur heard. Bradycardic  Pulmonary/Chest: Effort normal and breath sounds normal. No respiratory distress. He has no wheezes. He has no rales.  Abdominal: Soft. Bowel sounds are normal. He exhibits no distension. There is no abdominal tenderness. There is no guarding.  Musculoskeletal:        General: No edema.     Cervical back: Neck supple.  Neurological: He is alert and oriented to person, place, and time.  Skin: Skin is warm and dry. He is not diaphoretic.     Labs on Admission: I have personally reviewed following labs and imaging studies  CBC: Recent Labs  Lab 03/25/20 1619  WBC 7.8  HGB 15.6  HCT 45.3  MCV 87.8  PLT 227   Basic Metabolic Panel: Recent Labs  Lab 03/25/20 1619  NA 140  K 3.8  CL 103  CO2 27  GLUCOSE 119*  BUN 8  CREATININE 0.79  CALCIUM 9.4   GFR: Estimated Creatinine Clearance: 165.1 mL/min (by C-G formula  based on SCr of 0.79 mg/dL). Liver Function Tests: No results for input(s): AST, ALT, ALKPHOS, BILITOT, PROT, ALBUMIN in the last 168 hours. No results for input(s): LIPASE, AMYLASE in the last 168 hours. No results for input(s): AMMONIA in the last 168 hours. Coagulation Profile: No results for input(s): INR, PROTIME in the last 168 hours. Cardiac Enzymes: No results for input(s): CKTOTAL, CKMB, CKMBINDEX, TROPONINI in the last 168 hours. BNP (last 3 results) No results for input(s): PROBNP in the last 8760 hours. HbA1C: No results for input(s): HGBA1C in the last 72 hours. CBG: No results for input(s): GLUCAP in the last 168 hours. Lipid Profile: No results for input(s): CHOL, HDL, LDLCALC, TRIG, CHOLHDL, LDLDIRECT in the last 72 hours. Thyroid Function Tests: No results for input(s): TSH, T4TOTAL, FREET4, T3FREE, THYROIDAB in the last 72 hours. Anemia Panel: No results  for input(s): VITAMINB12, FOLATE, FERRITIN, TIBC, IRON, RETICCTPCT in the last 72 hours. Urine analysis: No results found for: COLORURINE, APPEARANCEUR, LABSPEC, [place], GLUCOSEU, HGBUR, BILIRUBINUR, KETONESUR, PROTEINUR, UROBILINOGEN, NITRITE, LEUKOCYTESUR  Radiological Exams on Admission: DG Chest 2 View  Result Date: 03/25/2020 CLINICAL DATA:  Chest pain EXAM: CHEST - 2 VIEW COMPARISON:  None. FINDINGS: The heart size and mediastinal contours are within normal limits. Both lungs are clear. The visualized skeletal structures are unremarkable. IMPRESSION: No active cardiopulmonary disease. Electronically Signed   By: Ulyses Jarred M.D.   On: 03/25/2020 17:14   CT ANGIO CHEST/ABD/PEL FOR DISSECTION W &/OR WO CONTRAST  Result Date: 03/26/2020 CLINICAL DATA:  Thoracic aortic aneurysm EXAM: CT ANGIOGRAPHY CHEST, ABDOMEN AND PELVIS TECHNIQUE: Multidetector CT imaging through the chest, abdomen and pelvis was performed using the standard protocol during bolus administration of intravenous contrast. Multiplanar  reconstructed images and MIPs were obtained and reviewed to evaluate the vascular anatomy. CONTRAST:  132mL OMNIPAQUE IOHEXOL 350 MG/ML SOLN COMPARISON:  12/17/2019 FINDINGS: CTA CHEST FINDINGS Cardiovascular: Mild dilatation of the ascending thoracic aorta, 4.1 cm maximally compared to 4.3 cm previously. Heart is normal size. No evidence of aortic dissection. No filling defects in the pulmonary arteries to suggest pulmonary emboli. Mediastinum/Nodes: No mediastinal, hilar, or axillary adenopathy. Trachea and esophagus are unremarkable. Thyroid unremarkable. Lungs/Pleura: Lungs are clear. No focal airspace opacities or suspicious nodules. No effusions. Musculoskeletal: Chest wall soft tissues are unremarkable. No acute bony abnormality. Review of the MIP images confirms the above findings. CTA ABDOMEN AND PELVIS FINDINGS VASCULAR Aorta: Normal caliber aorta without aneurysm, dissection, vasculitis or significant stenosis. Celiac: Patent without evidence of aneurysm, dissection, vasculitis or significant stenosis. SMA: Patent without evidence of aneurysm, dissection, vasculitis or significant stenosis. Renals: Both renal arteries are patent without evidence of aneurysm, dissection, vasculitis, fibromuscular dysplasia or significant stenosis. IMA: Patent without evidence of aneurysm, dissection, vasculitis or significant stenosis. Inflow: Patent without evidence of aneurysm, dissection, vasculitis or significant stenosis. Veins: No obvious venous abnormality within the limitations of this arterial phase study. Review of the MIP images confirms the above findings. NON-VASCULAR Hepatobiliary: No focal hepatic abnormality. Gallbladder unremarkable. Pancreas: No focal abnormality or ductal dilatation. Spleen: No focal abnormality.  Normal size. Adrenals/Urinary Tract: No adrenal abnormality. No focal renal abnormality. No stones or hydronephrosis. Urinary bladder is unremarkable. Stomach/Bowel: Stomach, large and small  bowel grossly unremarkable. Normal appendix. Lymphatic: No adenopathy Reproductive: No visible focal abnormality. Other: No free fluid or free air. Musculoskeletal: No acute bony abnormality. Review of the MIP images confirms the above findings. IMPRESSION: 4.1 cm ascending thoracic aortic aneurysm compared to 4.3 cm previously. No dissection. Recommend annual imaging followup by CTA or MRA. This recommendation follows 2010 ACCF/AHA/AATS/ACR/ASA/SCA/SCAI/SIR/STS/SVM Guidelines for the Diagnosis and Management of Patients with Thoracic Aortic Disease. Circulation. 2010; 121: Y185-U314. Aortic aneurysm NOS (ICD10-I71.9) No acute findings in the chest, abdomen or pelvis. Electronically Signed   By: Rolm Baptise M.D.   On: 03/26/2020 00:43    EKG: Independently reviewed.  Sinus bradycardia, heart rate 56.  No acute ischemic changes in comparison to prior tracing.  Assessment/Plan Principal Problem:   Chest pain Active Problems:   Cerebrovascular accident (CVA) due to embolism of cerebral artery (HCC)   Gout   Thoracic aortic aneurysm (HCC)   Sinus bradycardia   Chest pain: Appears atypical based on history.  Initial high-sensitivity troponin 58, repeat flat at 49.  EKG without acute ischemic changes.  Risk factors include family history of CAD.  Cardiology  recommended admission for trending troponin and echo in the morning. -Continue cardiac monitoring.  Patient received full dose aspirin.  Repeat troponin, if trending up, start heparin per ACS protocol.  Echo ordered.  Repeat EKG as needed for recurrence of chest pain.  Sinus bradycardia: Could also be contributing to some of his symptoms.  Heart rate in the 40s to 50s.  Not on any AV nodal blocking agent.  EKG without evidence of heart block. -Continue cardiac monitoring and check TSH level.  Functionally bicuspid aortic valve: No murmur appreciated on exam.  Echo in a.m.  Ascending thoracic aortic aneurysm: Currently 4.1 cm on CT angiogram  compared to 4.3 cm previously.  No dissection. Radiologist recommending annual imaging follow-up with CTA or MRA.    CVA: Continue aspirin and Lipitor.  Gout: Continue allopurinol  DVT prophylaxis: Lovenox Code Status: Full code Family Communication: No family available at this time. Disposition Plan: Anticipate discharge in 1 to 2 days. Consults called: Cardiology Admission status: It is my clinical opinion that referral for OBSERVATION is reasonable and necessary in this patient based on the above information provided. The aforementioned taken together are felt to place the patient at high risk for further clinical deterioration. However it is anticipated that the patient may be medically stable for discharge from the hospital within 24 to 48 hours.  The medical decision making on this patient was of high complexity and the patient is at high risk for clinical deterioration, therefore this is a level 3 visit.  John Giovanni MD [facility]  If 7PM-7AM, please contact night-coverage [email or web address]  03/26/2020, 3:11 AM

## 2020-03-26 NOTE — Progress Notes (Signed)
  Echocardiogram 2D Echocardiogram has been performed.  Adam Mayer 03/26/2020, 5:05 PM

## 2020-03-26 NOTE — Discharge Instructions (Signed)

## 2020-03-26 NOTE — ED Notes (Signed)
Pt alert and oriented, up to BR without dizziness or SOB.  No needs noted.

## 2020-03-26 NOTE — ED Notes (Signed)
Attempt to call report x 1  

## 2020-03-26 NOTE — Telephone Encounter (Signed)
Reviewed thanks! 

## 2020-03-26 NOTE — ED Notes (Signed)
Pt provided supplies to wash up and change gown.

## 2020-03-26 NOTE — ED Provider Notes (Signed)
[facility] EMERGENCY DEPARTMENT Provider Note   CSN: [id number] Arrival date & time: 03/25/20  1608     History Chief Complaint  Patient presents with  . Chest Pain    Adam Mayer is a 39 y.o. male.  Patient presents to the emergency department for evaluation of chest pain.  Patient reports a history of patent foramen ovale closure.  He does not have any history of coronary artery disease, but does have a known thoracic aortic aneurysm.  He reports that he was not feeling well earlier this morning, feeling somewhat weak and dizzy.  Around lunchtime he had some increased stress at work and then started to notice a tightness in the left side of his chest.  That has been waxing and waning through the day.  No shortness of breath.        Past Medical History:  Diagnosis Date  . Cardiac murmur, unspecified   . Gout   . Stroke [facility])     Patient Active Problem List   Diagnosis Date Noted  . Cerebrovascular accident (CVA) due to embolism of cerebral artery (HCC)   . PFO (patent foramen ovale)     Past Surgical History:  Procedure Laterality Date  . BUBBLE STUDY  09/27/2019   Procedure: BUBBLE STUDY;  Surgeon: Jodelle Red, MD;  Location: [facility] ENDOSCOPY;  Service: Cardiovascular;;  . PATENT FORAMEN OVALE(PFO) CLOSURE N/A 10/05/2019   Procedure: PATENT FORAMEN OVALE (PFO) CLOSURE;  Surgeon: Tonny Bollman, MD;  Location: [facility] INVASIVE CV LAB;  Service: Cardiovascular;  Laterality: N/A;  . TEE WITHOUT CARDIOVERSION N/A 09/27/2019   Procedure: TRANSESOPHAGEAL ECHOCARDIOGRAM (TEE);  Surgeon: Jodelle Red, MD;  Location: [facility] ENDOSCOPY;  Service: Cardiovascular;  Laterality: N/A;       Family History  Problem Relation Age of Onset  . Heart murmur Mother   . Benign prostatic hyperplasia Father   . Other Father        knee replacement  . CAD Maternal Grandfather        CABG X4  . Atrial fibrillation Maternal Grandfather        Had  pacemaker    Social History   Tobacco Use  . Smoking status: Never Smoker  . Smokeless tobacco: Never Used  Substance Use Topics  . Alcohol use: Yes    Alcohol/week: 2.0 standard drinks    Types: 2 Cans of beer per week    Comment: social  . Drug use: Never    Home Medications Prior to Admission medications   Medication Sig Start Date End Date Taking? Authorizing Provider  allopurinol (ZYLOPRIM) 300 MG tablet Take 300 mg by mouth daily.    [provider]  aspirin EC 81 MG tablet Take 1 tablet (81 mg total) by mouth daily. 10/03/19   Tonny Bollman, MD  atorvastatin (LIPITOR) 40 MG tablet Take 1 tablet (40 mg total) by mouth at bedtime. 09/21/19   Berton Bon, NP  clindamycin (CLEOCIN) 300 MG capsule Take 2 capsules (600 mg total) by mouth as directed. Take 2 capsules 1 hour prior to dental work, including cleanings for 6 months 10/05/19 04/04/20  Janetta Hora, PA-C  Multiple Vitamins-Minerals (MULTIVITAMIN WITH MINERALS) tablet Take 1 tablet by mouth daily.    [provider]  Omega-3 Fatty Acids (FISH OIL) 1000 MG CAPS Take 2,000 mg by mouth daily.    [provider]    Allergies    Penicillins  Review of Systems   Review of Systems  Cardiovascular: Positive  for chest pain.  All other systems reviewed and are negative.   Physical Exam Updated Vital Signs BP (!) 129/99   Pulse (!) 51   Temp 98.2 F (36.8 C) (Oral)   Resp 13   Ht 6\' 1"  (1.854 m)   Wt 117.9 kg   SpO2 100%   BMI 34.30 kg/m   Physical Exam Vitals and nursing note reviewed.  Constitutional:      General: He is not in acute distress.    Appearance: Normal appearance. He is well-developed.  HENT:     Head: Normocephalic and atraumatic.     Right Ear: Hearing normal.     Left Ear: Hearing normal.     Nose: Nose normal.  Eyes:     Conjunctiva/sclera: Conjunctivae normal.     Pupils: Pupils are equal, round, and reactive to light.  Cardiovascular:     Rate  and Rhythm: Regular rhythm.     Heart sounds: S1 normal and S2 normal. No murmur. No friction rub. No gallop.   Pulmonary:     Effort: Pulmonary effort is normal. No respiratory distress.     Breath sounds: Normal breath sounds.  Chest:     Chest wall: No tenderness.  Abdominal:     General: Bowel sounds are normal.     Palpations: Abdomen is soft.     Tenderness: There is no abdominal tenderness. There is no guarding or rebound. Negative signs include Murphy's sign and McBurney's sign.     Hernia: No hernia is present.  Musculoskeletal:        General: Normal range of motion.     Cervical back: Normal range of motion and neck supple.  Skin:    General: Skin is warm and dry.     Findings: No rash.  Neurological:     Mental Status: He is alert and oriented to person, place, and time.     GCS: GCS eye subscore is 4. GCS verbal subscore is 5. GCS motor subscore is 6.     Cranial Nerves: No cranial nerve deficit.     Sensory: No sensory deficit.     Coordination: Coordination normal.  Psychiatric:        Speech: Speech normal.        Behavior: Behavior normal.        Thought Content: Thought content normal.     ED Results / Procedures / Treatments   Labs (all labs ordered are listed, but only abnormal results are displayed) Labs Reviewed  BASIC METABOLIC PANEL - Abnormal; Notable for the following components:      Result Value   Glucose, Bld 119 (*)    All other components within normal limits  TROPONIN I (HIGH SENSITIVITY) - Abnormal; Notable for the following components:   Troponin I (High Sensitivity) 50 (*)    All other components within normal limits  TROPONIN I (HIGH SENSITIVITY) - Abnormal; Notable for the following components:   Troponin I (High Sensitivity) 49 (*)    All other components within normal limits  CBC    EKG None  Radiology DG Chest 2 View  Result Date: 03/25/2020 CLINICAL DATA:  Chest pain EXAM: CHEST - 2 VIEW COMPARISON:  None. FINDINGS: The  heart size and mediastinal contours are within normal limits. Both lungs are clear. The visualized skeletal structures are unremarkable. IMPRESSION: No active cardiopulmonary disease. Electronically Signed   By: 03/27/2020 M.D.   On: 03/25/2020 17:14   CT ANGIO CHEST/ABD/PEL FOR DISSECTION W &/  OR WO CONTRAST  Result Date: 03/26/2020 CLINICAL DATA:  Thoracic aortic aneurysm EXAM: CT ANGIOGRAPHY CHEST, ABDOMEN AND PELVIS TECHNIQUE: Multidetector CT imaging through the chest, abdomen and pelvis was performed using the standard protocol during bolus administration of intravenous contrast. Multiplanar reconstructed images and MIPs were obtained and reviewed to evaluate the vascular anatomy. CONTRAST:  OMNIPAQUE IOHEXOL 350 MG/ML SOLN COMPARISON:  12/17/2019 FINDINGS: CTA CHEST FINDINGS Cardiovascular: Mild dilatation of the ascending thoracic aorta, 4.1 cm maximally compared to 4.3 cm previously. Heart is normal size. No evidence of aortic dissection. No filling defects in the pulmonary arteries to suggest pulmonary emboli. Mediastinum/Nodes: No mediastinal, hilar, or axillary adenopathy. Trachea and esophagus are unremarkable. Thyroid unremarkable. Lungs/Pleura: Lungs are clear. No focal airspace opacities or suspicious nodules. No effusions. Musculoskeletal: Chest wall soft tissues are unremarkable. No acute bony abnormality. Review of the MIP images confirms the above findings. CTA ABDOMEN AND PELVIS FINDINGS VASCULAR Aorta: Normal caliber aorta without aneurysm, dissection, vasculitis or significant stenosis. Celiac: Patent without evidence of aneurysm, dissection, vasculitis or significant stenosis. SMA: Patent without evidence of aneurysm, dissection, vasculitis or significant stenosis. Renals: Both renal arteries are patent without evidence of aneurysm, dissection, vasculitis, fibromuscular dysplasia or significant stenosis. IMA: Patent without evidence of aneurysm, dissection, vasculitis or  significant stenosis. Inflow: Patent without evidence of aneurysm, dissection, vasculitis or significant stenosis. Veins: No obvious venous abnormality within the limitations of this arterial phase study. Review of the MIP images confirms the above findings. NON-VASCULAR Hepatobiliary: No focal hepatic abnormality. Gallbladder unremarkable. Pancreas: No focal abnormality or ductal dilatation. Spleen: No focal abnormality.  Normal size. Adrenals/Urinary Tract: No adrenal abnormality. No focal renal abnormality. No stones or hydronephrosis. Urinary bladder is unremarkable. Stomach/Bowel: Stomach, large and small bowel grossly unremarkable. Normal appendix. Lymphatic: No adenopathy Reproductive: No visible focal abnormality. Other: No free fluid or free air. Musculoskeletal: No acute bony abnormality. Review of the MIP images confirms the above findings. IMPRESSION: 4.1 cm ascending thoracic aortic aneurysm compared to 4.3 cm previously. No dissection. Recommend annual imaging followup by CTA or MRA. This recommendation follows 2010 ACCF/AHA/AATS/ACR/ASA/SCA/SCAI/SIR/STS/SVM Guidelines for the Diagnosis and Management of Patients with Thoracic Aortic Disease. Circulation. 2010; 121: C376-E831. Aortic aneurysm NOS (ICD10-I71.9) No acute findings in the chest, abdomen or pelvis. Electronically Signed   By: Charlett Nose M.D.   On: 03/26/2020 00:43    Procedures Procedures (including critical care time)  Medications Ordered in ED Medications  iohexol (OMNIPAQUE) 350 MG/ML injection 100 mL (100 mLs Intravenous Contrast Given 03/26/20 0025)    ED Course  I have reviewed the triage vital signs and the nursing notes.  Pertinent labs & imaging results that were available during my care of the patient were reviewed by me and considered in my medical decision making (see chart for details).    MDM Rules/Calculators/A&P                      Patient presents to the emergency department for evaluation of chest  pain.  Patient reports that he has been having a lot of stress at work today and noticed that he was having a tightness in the left chest intermittently through the course of today.  He does not have any known history of coronary artery disease but does have a previous history of surgically corrected patent foramen ovale as well as thoracic aortic aneurysm.  Patient is EKG at arrival is similar to previous.  No obvious ischemia or infarct.  He does  have a mildly elevated troponin of 50 at arrival.  Second troponin is flat at 49.  Significance of these mild elevations is unclear at this time.  He did undergo CT angiography of chest abdomen and pelvis, aneurysm is stable, no other pathology noted.  Discussed with Dr. Radford Pax, on-call for cardiology.  She recommends hospitalist admission today and possible echo in the morning to further evaluate.  Final Clinical Impression(s) / ED Diagnoses Final diagnoses:  Chest pain, unspecified type    Rx / DC Orders ED Discharge Orders    None       Jeaninne Lodico, Gwenyth Allegra, MD 03/26/20 [id number]

## 2020-03-26 NOTE — ED Notes (Signed)
SDU  bfast ordered  

## 2020-03-26 NOTE — Discharge Summary (Addendum)
DISCHARGE SUMMARY  Adam Mayer  MR#: [id number]  DOB:1981-09-08  Date of Admission: 03/25/2020 Date of Discharge: 03/26/2020  Attending Physician:Shaquela Weichert Hennie Duos, MD  Patient's PCP:Pc, [facility]  Consults: none   Disposition: D/C home   Follow-up Appts: Follow-up Information    Burnell Blanks, MD Follow up on 04/02/2020.   Specialty: Cardiology Why: 3:20 PM  Contact information: [address] [place] [address] [phone]           Tests Needing Follow-up: Consider if further cardiac risk stratification testing is indicated   Discharge Diagnoses: Atypical chest pain Sinus bradycardia Bicuspid aortic valve Thoracic aortic aneurysm -POA PFO status post closure History of CVA Gout  Initial presentation: 44 with a history of bicuspid aortic valve, thoracic aortic aneurysm, PFO status post closure, CVA, and gout who presented with complaints of chest pain.  At initial onset he felt only dizziness.  Later while sitting he began to develop 5 out of 10 nonradiating chest pressure.  This persisted for several hours.  At the time of his presentation he was pain-free.  In the ED he was found to be bradycardic with heart rate of 40-50.  EKG was without acute findings.  CT angio chest was negative for PE or dissection and confirmed his known thoracic aortic aneurysm which was stable in size.  Hospital Course: The patient was admitted to the acute units.  Serial troponins were fortunately unrevealing.  His EKG was nonacute.  As noted in the history his symptoms have resolved even prior to his admission.  He also had an unrevealing CT angiogram of the chest.  Vitals were stable and labs were otherwise unrevealing during his admission.  Further history revealed that the patient had eaten very little having essentially skipped breakfast when he began to feel "dizzy" late in the morning/at lunchtime.  He then ate a very large meal  rapidly.  Not long after that he began experience the chest pressure during rest with no radiation and no association with exertion.  By the time he presented to the ED his symptoms have resolved.  Given the atypical description of his pain, his unrevealing EKG, and his negative troponins it is felt highly unlikely that this represented a cardiac ischemic event.  The patient is already established with a cardiologist who is intimately familiar with his care.  The patient agrees to outpatient follow-up with his cardiologist.  I have contacted the cart master who has promptly arranged for a visit in 1 week with the patient's cardiologist.  He is instructed to return immediately to the emergency room via EMS should he develop recurrent chest pain shortness of breath or suffer a syncopal spell.  During his hospital stay his resting heart rate did prove to be in the 52-60 range.  There were episodes when his heart rate dropped into the upper 40s.  The patient did not appear to be symptomatic during any of the spells and maintain his blood pressure throughout.  It is felt that this is simply related to his age as he is on no rate affecting medications.  This can be followed by his cardiologist in follow-up.  Allergies as of 03/26/2020      Reactions   Penicillins Other (See Comments)   Did it involve swelling of the face/tongue/throat, SOB, or low BP? Unknown Did it involve sudden or severe rash/hives, skin peeling, or any reaction on the inside of your mouth or nose? Unknown Did you need to  seek medical attention at a hospital or doctor's office? Unknown When did it last happen?childhood If all above answers are "NO", may proceed with cephalosporin use.      Medication List    STOP taking these medications   clindamycin 300 MG capsule Commonly known as: CLEOCIN     TAKE these medications   allopurinol 300 MG tablet Commonly known as: ZYLOPRIM Take 300 mg by mouth daily.   aspirin EC 81 MG  tablet Take 1 tablet (81 mg total) by mouth daily.   atorvastatin 40 MG tablet Commonly known as: LIPITOR Take 1 tablet (40 mg total) by mouth at bedtime.   Fish Oil 1000 MG Caps Take 2,000 mg by mouth daily.   multivitamin with minerals tablet Take 1 tablet by mouth daily.       Day of Discharge BP 130/75 (BP Location: Right Arm)   Pulse (!) 58   Temp 98 F (36.7 C) (Oral)   Resp 17   Ht 6\' 1"  (1.854 m)   Wt 118.5 kg   SpO2 99%   BMI 34.46 kg/m   Physical Exam: General: No acute respiratory distress Lungs: Clear to auscultation bilaterally without wheezes or crackles Cardiovascular: Regular rate and rhythm without murmur gallop or rub normal S1 and S2 Abdomen: Nontender, nondistended, soft, bowel sounds positive, no rebound, no ascites, no appreciable mass Extremities: No significant cyanosis, clubbing, or edema bilateral lower extremities  Basic Metabolic Panel: Recent Labs  Lab 03/25/20 1619  NA 140  K 3.8  CL 103  CO2 27  GLUCOSE 119*  BUN 8  CREATININE 0.79  CALCIUM 9.4   CBC: Recent Labs  Lab 03/25/20 1619  WBC 7.8  HGB 15.6  HCT 45.3  MCV 87.8  PLT 227    Recent Results (from the past 240 hour(s))  SARS CORONAVIRUS 2 (TAT 6-24 HRS) Nasopharyngeal Nasopharyngeal Swab     Status: None   Collection Time: 03/26/20  1:53 AM   Specimen: Nasopharyngeal Swab  Result Value Ref Range Status   SARS Coronavirus 2 NEGATIVE NEGATIVE Final    Comment: (NOTE) SARS-CoV-2 target nucleic acids are NOT DETECTED. The SARS-CoV-2 RNA is generally detectable in upper and lower respiratory specimens during the acute phase of infection. Negative results do not preclude SARS-CoV-2 infection, do not rule out co-infections with other pathogens, and should not be used as the sole basis for treatment or other patient management decisions. Negative results must be combined with clinical observations, patient history, and epidemiological information. The  expected result is Negative. Fact Sheet for Patients: 03/28/20 Fact Sheet for Healthcare Providers: HairSlick.no This test is not yet approved or cleared by the quierodirigir.com FDA and  has been authorized for detection and/or diagnosis of SARS-CoV-2 by FDA under an Emergency Use Authorization (EUA). This EUA will remain  in effect (meaning this test can be used) for the duration of the COVID-19 declaration under Section 56 4(b)(1) of the Act, 21 U.S.C. section 360bbb-3(b)(1), unless the authorization is terminated or revoked sooner. Performed at [facility], [address] [place], [place]       Time spent in discharge (includes decision making & examination of pt): 30 minutes  03/26/2020, 3:39 PM   03/28/2020, MD [facility] Office  [phone]

## 2020-04-02 ENCOUNTER — Other Ambulatory Visit: Payer: Self-pay

## 2020-04-02 ENCOUNTER — Encounter: Payer: Self-pay | Admitting: Cardiovascular Disease

## 2020-04-02 ENCOUNTER — Ambulatory Visit (INDEPENDENT_AMBULATORY_CARE_PROVIDER_SITE_OTHER): Payer: 59 | Admitting: Cardiovascular Disease

## 2020-04-02 VITALS — BP 116/70 | HR 61 | Ht 73.0 in | Wt 265.0 lb

## 2020-04-02 DIAGNOSIS — Z8774 Personal history of (corrected) congenital malformations of heart and circulatory system: Secondary | ICD-10-CM

## 2020-04-02 DIAGNOSIS — I712 Thoracic aortic aneurysm, without rupture, unspecified: Secondary | ICD-10-CM

## 2020-04-02 NOTE — Progress Notes (Signed)
Chief Complaint  Patient presents with  . Follow-up    Thoracic aortic aneurysm   History of Present Illness: 39 yo male with history of PFO s/p closure, prior CVA who is here today for cardiac follow up. He was seen in my office in November 2019 for evaluation of a cardiac murmur. Echo December 2019 with normal LV systolic function and no significant valve disease. He is known to have a thoracic aortic aneurysm, 4.2 cm by CTA chest in January of 2020 and 2021. He was seen in the ED October 2020 with stroke like symptoms. MRI showed a small acute infarction in the right corona radiata and small old infarctions in the left putamen and old infarction in the left cerebellum. Echo with bubble study suggested a small PFO. Dopplers were negative for DVT. He was started on an ASA and statin. Hypercoagulable workup negative. TEE 09/27/19 with functionally bicuspid aortic valve with moderate to large PFO. He underwent PFO closure 10/05/19. Echo 10/05/19 with normal LV function and no atrial level shunt. He was discharged on ASA and Plavix. He was admitted to [facility] 03/26/20 with chest pain and dizziness. CTA chest was negative for PE or dissection and confirmed his known thoracic aortic aneurysm which was stable in size. Troponin was negative.   He is here today for follow up. He is feeling much better. No chest pain or dizziness. He has been eating snacks at work an drinking plenty of water.    Primary Care Physician: Pc, [facility] [place], NP)  Past Medical History:  Diagnosis Date  . Cardiac murmur, unspecified   . Gout   . Stroke [facility])    Past Surgical History:  No surgeries  Current Outpatient Medications  Medication Sig Dispense Refill  . allopurinol (ZYLOPRIM) 300 MG tablet Take 300 mg by mouth daily.    [name] aspirin EC 81 MG tablet Take 1 tablet (81 mg total) by mouth daily. 90 tablet 3  . atorvastatin (LIPITOR) 40 MG tablet Take 1 tablet (40 mg total) by mouth at bedtime.  90 tablet 3  . Multiple Vitamins-Minerals (MULTIVITAMIN WITH MINERALS) tablet Take 1 tablet by mouth daily.    . Omega-3 Fatty Acids (FISH OIL) 1000 MG CAPS Take 2,000 mg by mouth daily.     No current facility-administered medications for this visit.    Allergies  Allergen Reactions  . Penicillins Other (See Comments)    Did it involve swelling of the face/tongue/throat, SOB, or low BP? Unknown Did it involve sudden or severe rash/hives, skin peeling, or any reaction on the inside of your mouth or nose? Unknown Did you need to seek medical attention at a hospital or doctor's office? Unknown When did it last happen?childhood If all above answers are "NO", may proceed with cephalosporin use.     Social History   Socioeconomic History  . Marital status: Married    Spouse name: Not on file  . Number of children: 1  . Years of education: Not on file  . Highest education level: Not on file  Occupational History  . Occupation: manages a Chief Technology Officer  Tobacco Use  . Smoking status: Never Smoker  . Smokeless tobacco: Never Used  Substance and Sexual Activity  . Alcohol use: Yes    Alcohol/week: 2.0 standard drinks    Types: 2 Cans of beer per week    Comment: social  . Drug use: Never  . Sexual activity: Yes  Other Topics Concern  .  Not on file  Social History Narrative  . Not on file   Social Determinants of Health   Financial Resource Strain:   . Difficulty of Paying Living Expenses:   Food Insecurity:   . Worried About Programme researcher, broadcasting/film/video in the Last Year:   . Barista in the Last Year:   Transportation Needs:   . Freight forwarder (Medical):   [name] Lack of Transportation (Non-Medical):   Physical Activity:   . Days of Exercise per Week:   . Minutes of Exercise per Session:   Stress:   . Feeling of Stress :   Social Connections:   . Frequency of Communication with Friends and Family:   . Frequency of Social Gatherings with Friends and  Family:   . Attends Religious Services:   . Active Member of Clubs or Organizations:   . Attends Banker Meetings:   [name] Marital Status:   Intimate Partner Violence:   . Fear of Current or Ex-Partner:   . Emotionally Abused:   [name] Physically Abused:   . Sexually Abused:     Family History  Problem Relation Age of Onset  . Heart murmur Mother   . Benign prostatic hyperplasia Father   . Other Father        knee replacement  . CAD Maternal Grandfather        CABG X4  . Atrial fibrillation Maternal Grandfather        Had pacemaker    Review of Systems:  As stated in the HPI and otherwise negative.   BP 116/70   Pulse 61   Ht 6\' 1"  (1.854 m)   Wt 265 lb (120.2 kg)   SpO2 96%   BMI 34.96 kg/m   Physical Examination: General: Well developed, well nourished, NAD  HEENT: OP clear, mucus membranes moist  SKIN: warm, dry. No rashes. Neuro: No focal deficits  Musculoskeletal: Muscle strength 5/5 all ext  Psychiatric: Mood and affect normal  Neck: No JVD, no carotid bruits, no thyromegaly, no lymphadenopathy.  Lungs:Clear bilaterally, no wheezes, rhonci, crackles Cardiovascular: Regular rate and rhythm. No murmurs, gallops or rubs. Abdomen:Soft. Bowel sounds present. Non-tender.  Extremities: No lower extremity edema. Pulses are 2 + in the bilateral DP/PT.  EKG:  EKG is not ordered today. The ekg ordered today demonstrates   Echo April 2021: 1. Left ventricular ejection fraction, by estimation, is 55 to 60%. The  left ventricle has normal function. The left ventricle has no regional  wall motion abnormalities. Left ventricular diastolic parameters were  normal.  2. Right ventricular systolic function is normal. The right ventricular  size is normal. Tricuspid regurgitation signal is inadequate for assessing  PA pressure.  3. PFO closure device present without evidence of shunting.  4. The mitral valve is grossly normal. No evidence of mitral valve    regurgitation. No evidence of mitral stenosis.  5. The aortic valve is tricuspid. Aortic valve regurgitation is not  visualized. No aortic stenosis is present.  6. Aortic dilatation noted. Aneurysm of the ascending aorta, measuring 43  mm.  7. The inferior vena cava is normal in size with greater than 50%  respiratory variability, suggesting right atrial pressure of 3 mmHg.  Recent Labs: 03/25/2020: BUN 8; Creatinine, Ser 0.79; Hemoglobin 15.6; Platelets 227; Potassium 3.8; Sodium 140 03/26/2020: TSH 3.677   Lipid Panel No results found for: CHOL, TRIG, HDL, CHOLHDL, VLDL, LDLCALC, LDLDIRECT   Wt Readings from Last 3 Encounters:  04/02/20 265 lb (120.2 kg)  03/26/20 261 lb 3.2 oz (118.5 kg)  02/18/20 268 lb (121.6 kg)     Other studies Reviewed: Additional studies/ records that were reviewed today include:  Review of the above records demonstrates:    Assessment and Plan:   1. Patent foramen ovale: He is s/p PFO closure October 2020. Continue ASA  2. Functionally bicuspid aortic valve with thoracic aortic aneurysm: 4.2 cm thoracic aortic aneurysm stable by chest CTA April 2021.Echo April 2021 with LVEF=55-60%. No aortic stenosis present. Repeat chet CTA April 2022.   3 Chest pain: Atypical. Does not sound cardiac. No recurrence over the past week.   Current medicines are reviewed at length with the patient today.  The patient does not have concerns regarding medicines.  The following changes have been made:  no change  Labs/ tests ordered today include:   No orders of the defined types were placed in this encounter.    Disposition:   FU with me in 12 months   Signed, Lauree Chandler, MD 04/02/2020 3:59 PM    [facility] [place], [place], [place]  [address] Phone: [phone]; Fax: [phone]

## 2020-04-02 NOTE — Patient Instructions (Signed)
Medication Instructions:  No changes *If you need a refill on your cardiac medications before your next appointment, please call your pharmacy*  Lab Work: none If you have labs (blood work) drawn today and your tests are completely normal, you will receive your results only by: . MyChart Message (if you have MyChart) OR . A paper copy in the mail If you have any lab test that is abnormal or we need to change your treatment, we will call you to review the results.  Testing/Procedures: none  Follow-Up: At [facility], you and your health needs are our priority.  As part of our continuing mission to provide you with exceptional heart care, we have created designated Provider Care Teams.  These Care Teams include your primary Cardiologist (physician) and Advanced Practice Providers (APPs -  Physician Assistants and Nurse Practitioners) who all work together to provide you with the care you need, when you need it.  Your next appointment:   12 month(s)  The format for your next appointment:   Either In Person or Virtual  Provider:   You may see Celia McAlhany, MD or one of the following Advanced Practice Providers on your designated Care Team:    Dayna Dunn, PA-C  Michele Lenze, PA-C   Other Instructions   

## 2020-05-13 ENCOUNTER — Telehealth: Payer: Self-pay | Admitting: Cardiovascular Disease

## 2020-05-13 NOTE — Telephone Encounter (Signed)
Pt states over the last couple of weeks he has noticed intense cramping in legs and groin area.  Very painful when it happens.  He spoke with his pharmacist when he was there last week and they mentioned it could be the Atorvastatin.  Pt has not had Atorvastatin for 3 days now and has not had any cramping.  States cramping was random.  One episode, he had been in his recliner watching TV and when he went to get up, cramping occurred.  Has been drinking plenty of fluids so doesn't feel it is dehydration.  Also been having elbow pain but states that could be from the work he has been doing around the house.  Advised I will send to Dr. Clifton James for review.

## 2020-05-13 NOTE — Telephone Encounter (Signed)
Continue statin for now. If he has daily cramps and muscle aches, will need to consider changing statin. Thanks, chris

## 2020-05-13 NOTE — Telephone Encounter (Signed)
Pt c/o medication issue:  1. Name of Medication: atorvastatin (LIPITOR) 40 MG tablet  2. How are you currently taking this medication (dosage and times per day)? 1 tablet once a day at night  3. Are you having a reaction (difficulty breathing--STAT)? no  4. What is your medication issue? Patient states he's been having exessive cramps in his groin and legs. He states he never used to get cramp before and he has not changed his diet.

## 2020-05-13 NOTE — Telephone Encounter (Signed)
Spoke with pt and made him aware of recommendations.  Advised if cramping and muscle aches return, let us know.  Pt agreeable to plan.

## 2020-07-14 ENCOUNTER — Other Ambulatory Visit: Payer: Self-pay | Admitting: Physician Assistant

## 2020-07-14 DIAGNOSIS — Z8774 Personal history of (corrected) congenital malformations of heart and circulatory system: Secondary | ICD-10-CM

## 2020-08-12 ENCOUNTER — Other Ambulatory Visit (HOSPITAL_COMMUNITY): Payer: 59

## 2020-09-08 ENCOUNTER — Other Ambulatory Visit: Payer: Self-pay | Admitting: Physician Assistant

## 2020-09-08 DIAGNOSIS — Z8774 Personal history of (corrected) congenital malformations of heart and circulatory system: Secondary | ICD-10-CM

## 2020-10-07 NOTE — Progress Notes (Signed)
[facility]   MULTIDISCIPLINARY HEART VALVE CLINIC                                       Cardiology Office Note    Date:  10/08/2020   ID:  Adam Mayer, DOB 02-20-1981, MRN [id number]  PCP:  Pc, [facility]  Cardiologist: Dr. Clifton James  CC: 1 year s/p PFO closure   History of Present Illness:  Adam Mayer is a 39 y.o. male with a history of functionally bicuspid aortic valve with a dilated ascending aorta, CVA and PFO s/p PFO closure (10/05/19) who presents to clinic for follow up.    The patient has previously been seen by Dr. Clifton James for evaluation of heart murmur.  An echocardiogram last year showed normal LV systolic function and no significant valvular disease.  A CTA of the chest was done because of aortic dilatation and this demonstrated a dilated ascending aorta of 4 cm with annual follow-up recommended.  The patient was in his normal state of health until September 12, 2019 when he complained of sudden onset of left arm and leg numbness.  He was evaluated in the emergency department where a code stroke was called.  CT of the brain showed no changes of acute infarction but an MRI showed a small acute infarction in the right corona radiata and small old infarctions in the left caudate nucleus as well as an old lacunar infarction in the left putamen and an old infarction in the left cerebellum.  The patient had an updated echocardiogram with bubble study and this demonstrated the possibility of a small PFO. Doppler studies were negative for DVT. He was started on aspirin and a statin drug.  He had no residual symptoms and recovered well.  Hypercoagulable panel was done and showed no significant abnormalities.  The patient subsequently underwent a transesophageal echo which demonstrated a functionally bicuspid aortic valve and a moderate to large PFO with left-to-right color flow as well as evidence of right to left shunting by bubble study. He was seen  by Dr. Excell Seltzer for consultation for PFO closure and underwent successful transcatheter PFO closure using a 25 mm Amplatzer PFO on 10/04/20. Post op echo showed EF 65% with normally functioning device and no atrial level shunt. He was discharged on aspirin and plavix.  He was admitted to [facility] 03/26/20 with chest pain and dizziness. CTA chest was negative for PE or dissection and confirmed his known thoracic aortic aneurysm which was stable in size. Troponin was negative.   Today he presents to clinic for follow up. No CP or SOB. No LE edema, orthopnea or PND. No dizziness or syncope. No blood in stool or urine. He does get occasional palpitations from time to time. They are short lived and do no bother him and occur about once a month.    Past Medical History:  Diagnosis Date  . Cardiac murmur, unspecified   . Gout   . Stroke [facility])     Past Surgical History:  Procedure Laterality Date  . BUBBLE STUDY  09/27/2019   Procedure: BUBBLE STUDY;  Surgeon: Jodelle Red, MD;  Location: [facility] ENDOSCOPY;  Service: Cardiovascular;;  . PATENT FORAMEN OVALE(PFO) CLOSURE N/A 10/05/2019   Procedure: PATENT FORAMEN OVALE (PFO) CLOSURE;  Surgeon: Tonny Bollman, MD;  Location: [facility] INVASIVE CV LAB;  Service: Cardiovascular;  Laterality: N/A;  . TEE WITHOUT CARDIOVERSION N/A  09/27/2019   Procedure: TRANSESOPHAGEAL ECHOCARDIOGRAM (TEE);  Surgeon: Jodelle Red, MD;  Location: [facility] ENDOSCOPY;  Service: Cardiovascular;  Laterality: N/A;    Current Medications: Outpatient Medications Prior to Visit  Medication Sig Dispense Refill  . allopurinol (ZYLOPRIM) 300 MG tablet Take 300 mg by mouth daily.    . Ascorbic Acid (VITAMIN C) 100 MG tablet Take 100 mg by mouth daily.    [name] aspirin EC 81 MG tablet Take 1 tablet (81 mg total) by mouth daily. 90 tablet 3  . atorvastatin (LIPITOR) 40 MG tablet Take 1 tablet (40 mg total) by mouth at bedtime. 90 tablet 3  . Cholecalciferol 125 MCG (5000 UT) capsule Take  5,000 Units by mouth daily.    . Multiple Vitamins-Minerals (MULTIVITAMIN WITH MINERALS) tablet Take 1 tablet by mouth daily.    . Omega-3 Fatty Acids (FISH OIL) 1000 MG CAPS Take 2,000 mg by mouth daily.    . Zinc Sulfate (ZINC 15 PO) Take by mouth.    [name] azithromycin (ZITHROMAX) 250 MG tablet Take 250 mg by mouth as directed. (Patient not taking: Reported on 10/08/2020)    . Cholecalciferol (VITAMIN D3) 10 MCG (400 UNIT) tablet Take 400 Units by mouth daily. (Patient not taking: Reported on 10/08/2020)    . Cholecalciferol 125 MCG (5000 UT) capsule Take 5,000 Units by mouth daily. (Patient not taking: Reported on 10/08/2020)    . clopidogrel (PLAVIX) 75 MG tablet Take 75 mg by mouth daily. (Patient not taking: Reported on 10/08/2020)    . pravastatin (PRAVACHOL) 10 MG tablet Take by mouth. (Patient not taking: Reported on 10/08/2020)     No facility-administered medications prior to visit.     Allergies:   Penicillins   Social History   Socioeconomic History  . Marital status: Married    Spouse name: Not on file  . Number of children: 1  . Years of education: Not on file  . Highest education level: Not on file  Occupational History  . Occupation: manages a Chief Technology Officer  Tobacco Use  . Smoking status: Never Smoker  . Smokeless tobacco: Never Used  Substance and Sexual Activity  . Alcohol use: Yes    Alcohol/week: 2.0 standard drinks    Types: 2 Cans of beer per week    Comment: social  . Drug use: Never  . Sexual activity: Yes  Other Topics Concern  . Not on file  Social History Narrative  . Not on file   Social Determinants of Health   Financial Resource Strain:   . Difficulty of Paying Living Expenses: Not on file  Food Insecurity:   . Worried About Programme researcher, broadcasting/film/video in the Last Year: Not on file  . Ran Out of Food in the Last Year: Not on file  Transportation Needs:   . Lack of Transportation (Medical): Not on file  . Lack of Transportation (Non-Medical):  Not on file  Physical Activity:   . Days of Exercise per Week: Not on file  . Minutes of Exercise per Session: Not on file  Stress:   . Feeling of Stress : Not on file  Social Connections:   . Frequency of Communication with Friends and Family: Not on file  . Frequency of Social Gatherings with Friends and Family: Not on file  . Attends Religious Services: Not on file  . Active Member of Clubs or Organizations: Not on file  . Attends Banker Meetings: Not on file  . Marital Status: Not on  file     Family History:  The patient's family history includes Atrial fibrillation in his maternal grandfather; Benign prostatic hyperplasia in his father; CAD in his maternal grandfather; Heart murmur in his mother; Other in his father.     ROS:   Please see the history of present illness.    ROS All other systems reviewed and are negative.   PHYSICAL EXAM:   VS:  BP 110/84   Pulse 73   Ht 6\' 1"  (1.854 m)   Wt 265 lb 3.2 oz (120.3 kg)   SpO2 97%   BMI 34.99 kg/m    GEN: Well nourished, well developed, in no acute distress HEENT: normal Neck: no JVD or masses Cardiac: RRR; no murmurs, rubs, or gallops,no edema  Respiratory:  clear to auscultation bilaterally, normal work of breathing GI: soft, nontender, nondistended, + BS MS: no deformity or atrophy Skin: warm and dry, no rash Neuro:  Alert and Oriented x 3, Strength and sensation are intact Psych: euthymic mood, full affect   Wt Readings from Last 3 Encounters:  10/08/20 265 lb 3.2 oz (120.3 kg)  04/02/20 265 lb (120.2 kg)  03/26/20 261 lb 3.2 oz (118.5 kg)      Studies/Labs Reviewed:   EKG:  EKG is ordered today. ECG shows sinus brady HR 53 bpm  Recent Labs: 03/25/2020: BUN 8; Creatinine, Ser 0.79; Hemoglobin 15.6; Platelets 227; Potassium 3.8; Sodium 140 03/26/2020: TSH 3.677   Lipid Panel No results found for: CHOL, TRIG, HDL, CHOLHDL, VLDL, LDLCALC, LDLDIRECT  Additional studies/ records that were  reviewed today include:  10/05/19 PATENT FORAMEN OVALE (PFO) CLOSURE  Conclusion Successful transcatheter PFO closure using a 25 mm Amplatzer PFO Occluder under fluoroscopic and intracardiac echo guidance  Recommendations Antiplatelet/Anticoag Recommend uninterrupted dual antiplatelet therapy with Aspirin 81mg  daily and Clopidogrel 75mg  daily for 3 months.   ______________  Echo 10/05/19 IMPRESSIONS  1. Left ventricular ejection fraction, by visual estimation, is 60 to 65%. The left ventricle has normal function. Normal left ventricular size. There is mildly increased left ventricular hypertrophy.  2. Left ventricular diastolic parameters are consistent with Grade I diastolic dysfunction (impaired relaxation).  3. Global right ventricle has normal systolic function.The right ventricular size is normal. No increase in right ventricular wall thickness.  4. Left atrial size was normal.  5. Right atrial size was normal.  6. The mitral valve is normal in structure. No evidence of mitral valve regurgitation. No evidence of mitral stenosis.  7. The tricuspid valve is normal in structure. Tricuspid valve regurgitation is trivial.  8. The aortic valve is normal in structure. Aortic valve regurgitation is not visualized. No evidence of aortic valve sclerosis or stenosis.  9. The pulmonic valve was not assessed. Pulmonic valve regurgitation is not visualized. 10. The inferior vena cava is normal in size with greater than 50% respiratory variability, suggesting right atrial pressure of 3 mmHg. 11. An Amplatzer device is seen in the interatrial septum with no obvious left to right shunting. No subcostal views. Consider a bubble study for further assessment.  __________________  Echo with bubble 10/08/20 IMPRESSION  1. Left ventricular ejection fraction, by estimation, is 60 to 65%. The left ventricle has normal function. The left ventricle has no regional wall motion abnormalities. There is mild  concentric left ventricular hypertrophy. Left ventricular diastolic  parameters were normal.  2. Right ventricular systolic function is normal. The right ventricular size is normal.  3. The mitral valve is normal in structure. Trivial mitral valve  regurgitation. No evidence of mitral stenosis.  4. The aortic valve is tricuspid. Aortic valve regurgitation is not visualized. No aortic stenosis is present.  5. Aortic dilatation noted. There is mild dilatation of the ascending aorta, measuring 43 mm.  6. The inferior vena cava is normal in size with greater than 50% respiratory variability, suggesting right atrial pressure of 3 mmHg.  7. Agitated saline contrast bubble study was negative, with no evidence of any interatrial shunt.  ASSESSMENT & PLAN:   PFO s/p PFO closure: doing well. Echo today with normal LV function and negative bubble study. Continue on aspirin alone. No longer need SBE prophylaxis. Continue regular follow up with Dr. Clifton James.  Functionally bicuspid aortic valve with enlarged ascending aorta: echo today showed tricuspid AoV with mild dilatation of the ascending aorta, measuring 43 mm. Repeat CT due for 03/2021.   Palpitations/? SVT: our echo tech reported that he went into a rapid SVT twice during echo today. Unfortunately, this was not captured on tracings. ECG shows sinus brady. He did have subjective palpitations when this occurred and reports this happens to him from time to time. It does not bother him and he declines a heart monitor or beta blocker at this time.     Medication Adjustments/Labs and Tests Ordered: Current medicines are reviewed at length with the patient today.  Concerns regarding medicines are outlined above.  Medication changes, Labs and Tests ordered today are listed in the Patient Instructions below. Patient Instructions  Medication Instructions:  Your provider recommends that you continue on your current medications as directed. Please refer to the  Current Medication list given to you today.   *If you need a refill on your cardiac medications before your next appointment, please call your pharmacy*   Follow-Up: Please keep your follow-up with Dr. Clifton James as planned.     Signed, Cline Crock, PA-C  10/08/2020 2:31 PM    [facility] [address] [place], [place], [place]  [address] Phone: [phone]; Fax: [phone]

## 2020-10-08 ENCOUNTER — Ambulatory Visit (HOSPITAL_COMMUNITY): Payer: 59 | Attending: Cardiovascular Disease

## 2020-10-08 ENCOUNTER — Encounter: Payer: Self-pay | Admitting: Physician Assistant

## 2020-10-08 ENCOUNTER — Ambulatory Visit (INDEPENDENT_AMBULATORY_CARE_PROVIDER_SITE_OTHER): Payer: 59 | Admitting: Physician Assistant

## 2020-10-08 ENCOUNTER — Other Ambulatory Visit: Payer: Self-pay

## 2020-10-08 VITALS — BP 110/84 | HR 73 | Ht 73.0 in | Wt 265.2 lb

## 2020-10-08 DIAGNOSIS — I471 Supraventricular tachycardia: Secondary | ICD-10-CM | POA: Diagnosis not present

## 2020-10-08 DIAGNOSIS — Z8774 Personal history of (corrected) congenital malformations of heart and circulatory system: Secondary | ICD-10-CM | POA: Diagnosis not present

## 2020-10-08 DIAGNOSIS — I712 Thoracic aortic aneurysm, without rupture, unspecified: Secondary | ICD-10-CM

## 2020-10-08 DIAGNOSIS — Q231 Congenital insufficiency of aortic valve: Secondary | ICD-10-CM | POA: Diagnosis not present

## 2020-10-08 LAB — ECHOCARDIOGRAM COMPLETE BUBBLE STUDY
Area-P 1/2: 2.94 cm2
S' Lateral: 2.7 cm

## 2020-10-08 NOTE — Patient Instructions (Signed)
Medication Instructions:  Your provider recommends that you continue on your current medications as directed. Please refer to the Current Medication list given to you today.   *If you need a refill on your cardiac medications before your next appointment, please call your pharmacy*   Follow-Up: Please keep your follow-up with Dr. Clifton James as planned.

## 2020-10-22 ENCOUNTER — Other Ambulatory Visit: Payer: Self-pay | Admitting: Cardiovascular Disease

## 2021-04-23 NOTE — Progress Notes (Signed)
Cardiology Office Note   Date:  04/24/2021   ID:  Adam Mayer, DOB October 08, 1981, MRN [id number]  PCP:  Pc, [facility]  Cardiologist:  Dr. Clifton James    Chief Complaint  Patient presents with  . dilated aorta    Also s/p PFO closure      History of Present Illness: Adam Mayer is a 40 y.o. male who presents for a history of functionally bicuspid aortic valve with a dilated ascending aorta, CVA and PFO s/p PFO closure (10/05/19) who presents to clinic for follow up  An echocardiogram 2020 showed normal LV systolic function and no significant valvular disease. A CTA of the chest was done because of aortic dilatation and this demonstrated a dilated ascending aorta of 4 cm with annual follow-up recommended.  The patient was in his normal state of health until September 12, 2019 when he complained of sudden onset of left arm and leg numbness. He was evaluated in the emergency department where a code stroke was called. CT of the brain showed no changes of acute infarction but an MRI showed a small acute infarction in the right corona radiata and small old infarctions in the left caudate nucleus as well as an old lacunar infarction in the left putamen and an old infarction in the left cerebellum. The patient had an updated echocardiogram with bubble study and this demonstrated the possibility of a small PFO. Doppler studies were negative for DVT. He was started on aspirin and a statin drug. He had no residual symptoms and recovered well. Hypercoagulable panel was done and showed no significant abnormalities. The patient subsequently underwent a transesophageal echo which demonstrated a functionally bicuspid aortic valve and a moderate to large PFO with left-to-right color flow as well as evidence of right to left shunting by bubble study. He was seen by Dr. Excell Seltzer for consultation for PFO closure and underwent successful transcatheter PFO closure using a 25 mm Amplatzer  PFO on 10/04/20. Post op echo showed EF 65% with normally functioning device and no atrial level shunt. He was discharged on aspirin and plavix.  He was admitted to [facility] 03/26/20 with chest pain and dizziness.CTAchest was negative for PE or dissection and confirmed his known thoracic aortic aneurysm which was stable in size.Troponin was negative.   echo 10/2020 with EF 60-65%, mild concentric LVH tricuspid AV no regurg or stenosis  Aorta measuring at 43 mm,  No evidence of interatrial shunt post PFO closure.  Today  He has no chest pain or SOB.  No further stroke symptoms.  He is active and tries to lift only 50 lbs.  His diet mostly is healthy.  No COVID vaccine but did have COVID 19 last year.  He is having muscle cramps with lipitor, will hold for a week then begin crestor.  Discussed CoQ 10 but he wanted to try different statin.    Past Medical History:  Diagnosis Date  . Cardiac murmur, unspecified   . Gout   . Stroke [facility])     Past Surgical History:  Procedure Laterality Date  . BUBBLE STUDY  09/27/2019   Procedure: BUBBLE STUDY;  Surgeon: Jodelle Red, MD;  Location: [facility] ENDOSCOPY;  Service: Cardiovascular;;  . PATENT FORAMEN OVALE(PFO) CLOSURE N/A 10/05/2019   Procedure: PATENT FORAMEN OVALE (PFO) CLOSURE;  Surgeon: Tonny Bollman, MD;  Location[facility] INVASIVE CV LAB;  Service: Cardiovascular;  Laterality: N/A;  . TEE WITHOUT CARDIOVERSION N/A 09/27/2019   Procedure: TRANSESOPHAGEAL ECHOCARDIOGRAM (TEE);  Surgeon: Jodelle Red,  MD;  Location: [facility] ENDOSCOPY;  Service: Cardiovascular;  Laterality: N/A;     Current Outpatient Medications  Medication Sig Dispense Refill  . allopurinol (ZYLOPRIM) 300 MG tablet Take 300 mg by mouth daily.    . Ascorbic Acid (VITAMIN C) 100 MG tablet Take 100 mg by mouth daily.    [name] aspirin EC 81 MG tablet Take 1 tablet (81 mg total) by mouth daily. 90 tablet 3  . Cholecalciferol 125 MCG (5000 UT) capsule Take 5,000 Units by mouth  daily.    . Multiple Vitamins-Minerals (MULTIVITAMIN WITH MINERALS) tablet Take 1 tablet by mouth daily.    . Omega-3 Fatty Acids (FISH OIL) 1000 MG CAPS Take 2,000 mg by mouth daily.    . Zinc Sulfate (ZINC 15 PO) Take by mouth.     No current facility-administered medications for this visit.    Allergies:   Penicillins    Social History:  The patient  reports that he has never smoked. He has never used smokeless tobacco. He reports current alcohol use of about 2.0 standard drinks of alcohol per week. He reports that he does not use drugs.   Family History:  The patient's family history includes Atrial fibrillation in his maternal grandfather; Benign prostatic hyperplasia in his father; CAD in his maternal grandfather; Heart murmur in his mother; Other in his father.    ROS:  General:no colds or fevers, no weight changes Skin:no rashes or ulcers HEENT:no blurred vision, no congestion CV:see HPI PUL:see HPI GI:no diarrhea constipation or melena, no indigestion GU:no hematuria, no dysuria MS:no joint pain, no claudication Neuro:no syncope, no lightheadedness Endo:no diabetes, no thyroid disease  Wt Readings from Last 3 Encounters:  04/24/21 266 lb (120.7 kg)  10/08/20 265 lb 3.2 oz (120.3 kg)  04/02/20 265 lb (120.2 kg)     PHYSICAL EXAM: VS:  BP 116/60   Pulse 60   Ht 6\' 4"  (1.93 m)   Wt 266 lb (120.7 kg)   SpO2 97%   BMI 32.38 kg/m  , BMI Body mass index is 32.38 kg/m. General:Pleasant affect, NAD Skin:Warm and dry, brisk capillary refill HEENT:normocephalic, sclera clear, mucus membranes moist Neck:supple, no JVD, no bruits  Heart:S1S2 RRR without murmur, gallup, rub or click Lungs:clear without rales, rhonchi, or wheezes , non tender, + BS, do not palpate liver spleen or masses Ext:no lower ext edema, 2+ pedal pulses, 2+ radial pulses Neuro:alert and oriented X 3, MAE, follows commands, + facial symmetry    EKG:  EKG is NOT ordered today.    Recent  Labs: No results found for requested labs within last 8760 hours.    Lipid Panel No results found for: CHOL, TRIG, HDL, CHOLHDL, VLDL, LDLCALC, LDLDIRECT     Other studies Reviewed: Additional studies/ records that were reviewed today include: . Echo 03/26/20  IMPRESSIONS    1. Left ventricular ejection fraction, by estimation, is 55 to 60%. The  left ventricle has normal function. The left ventricle has no regional  wall motion abnormalities. Left ventricular diastolic parameters were  normal.  2. Right ventricular systolic function is normal. The right ventricular  size is normal. Tricuspid regurgitation signal is inadequate for assessing  PA pressure.  3. PFO closure device present without evidence of shunting.  4. The mitral valve is grossly normal. No evidence of mitral valve  regurgitation. No evidence of mitral stenosis.  5. The aortic valve is tricuspid. Aortic valve regurgitation is not  visualized. No aortic stenosis is present.  6. Aortic  dilatation noted. Aneurysm of the ascending aorta, measuring 43  mm.  7. The inferior vena cava is normal in size with greater than 50%  respiratory variability, suggesting right atrial pressure of 3 mmHg.   Comparison(s): No significant change from prior study.   Conclusion(s)/Recommendation(s): Ascending aortic aneurysm up to 43 mm.  Would refer to recent cross sectional imaging for accurate assessment.   FINDINGS  Left Ventricle: Left ventricular ejection fraction, by estimation, is 55  to 60%. The left ventricle has normal function. The left ventricle has no  regional wall motion abnormalities. The left ventricular internal cavity  size was normal in size. There is  no left ventricular hypertrophy. Left ventricular diastolic parameters  were normal. Normal left ventricular filling pressure.   Right Ventricle: The right ventricular size is normal. No increase in  right ventricular wall thickness. Right ventricular  systolic function is  normal. Tricuspid regurgitation signal is inadequate for assessing PA  pressure.   Left Atrium: Left atrial size was normal in size.   Right Atrium: Right atrial size was normal in size.   Pericardium: Trivial pericardial effusion is present. Presence of  pericardial fat pad.   Mitral Valve: The mitral valve is grossly normal. No evidence of mitral  valve regurgitation. No evidence of mitral valve stenosis.   Tricuspid Valve: The tricuspid valve is grossly normal. Tricuspid valve  regurgitation is mild . No evidence of tricuspid stenosis.   Aortic Valve: The aortic valve is tricuspid. Aortic valve regurgitation is  not visualized. No aortic stenosis is present.   Pulmonic Valve: The pulmonic valve was grossly normal. Pulmonic valve  regurgitation is not visualized. No evidence of pulmonic stenosis.   Aorta: Aortic dilatation noted. There is an aneurysm involving the  ascending aorta. The aneurysm measures 43 mm.   Venous: The inferior vena cava is normal in size with greater than 50%  respiratory variability, suggesting right atrial pressure of 3 mmHg.   IAS/Shunts: The atrial septum is grossly normal.      ASSESSMENT AND PLAN:  1.  S/p closure of PFO done 10/05/19 and neg bubble study 03/26/20 stable. Continue ASA 2.  Dilated aorta, at 43 mm has been stable on last 2 echoes. Will recheck in 9 months.  And follow up with Dr. Clifton James at that time. Order for  CTA of chest for dilated aorta.  Was placed in Nov but has not been scheduled.  Will go ahead with this.  Not on BB due to bradycardia.  In the low 50s at times.  3.  HLD on statin, will change to crestor due to muscle pain in thighs and calf with lipitor and recheck H&L in 6 weeks.   Pt had been on pravachol in past. Goal LDL <70  Discussed COVID precautions. Social distancing and masking as needed.  Washing hands.  Current medicines are reviewed with the patient today.  The patient Has no concerns  regarding medicines.  The following changes have been made:  See above Labs/ tests ordered today include:see above  Disposition:   FU:  see above  Signed, Nada Boozer, NP  04/24/2021 8:31 AM    [facility] [address] [place], [place], [place]  [address]/ [address] [place], [place] Phone: [phone]; Fax: [phone]  [phone]

## 2021-04-24 ENCOUNTER — Encounter: Payer: Self-pay | Admitting: Cardiology

## 2021-04-24 ENCOUNTER — Other Ambulatory Visit: Payer: Self-pay

## 2021-04-24 ENCOUNTER — Ambulatory Visit (INDEPENDENT_AMBULATORY_CARE_PROVIDER_SITE_OTHER): Payer: 59 | Admitting: Cardiology

## 2021-04-24 VITALS — BP 116/60 | HR 60 | Ht 76.0 in | Wt 266.0 lb

## 2021-04-24 DIAGNOSIS — Z79899 Other long term (current) drug therapy: Secondary | ICD-10-CM | POA: Diagnosis not present

## 2021-04-24 DIAGNOSIS — Q211 Atrial septal defect: Secondary | ICD-10-CM | POA: Diagnosis not present

## 2021-04-24 DIAGNOSIS — E782 Mixed hyperlipidemia: Secondary | ICD-10-CM

## 2021-04-24 DIAGNOSIS — Q2112 Patent foramen ovale: Secondary | ICD-10-CM

## 2021-04-24 DIAGNOSIS — I77810 Thoracic aortic ectasia: Secondary | ICD-10-CM | POA: Diagnosis not present

## 2021-04-24 MED ORDER — ROSUVASTATIN CALCIUM 20 MG PO TABS: 20.0000 mg | ORAL_TABLET | Freq: Every day | ORAL | 3 refills | Status: DC

## 2021-04-24 NOTE — Patient Instructions (Addendum)
Medication Instructions:  1) Discontinue Atorvastatin   2) Start Rosuvastatin (Crestor) 20 mg daily   *If you need a refill on your cardiac medications before your next appointment, please call your pharmacy*   Lab Work: Your physician recommends that you return for a FASTING lipid profile and hepatic function test in 6 weeks   If you have labs (blood work) drawn today and your tests are completely normal, you will receive your results only by: [name] MyChart Message (if you have MyChart) OR . A paper copy in the mail If you have any lab test that is abnormal or we need to change your treatment, we will call you to review the results.   Testing/Procedures: Your physician has requested that you have an echocardiogram in 9 months . Echocardiography is a painless test that uses sound waves to create images of your heart. It provides your doctor with information about the size and shape of your heart and how well your heart's chambers and valves are working. This procedure takes approximately one hour. There are no restrictions for this procedure.  Your physician has ordered for you to have a Chest CT  Follow-Up: At [facility], you and your health needs are our priority.  As part of our continuing mission to provide you with exceptional heart care, we have created designated Provider Care Teams.  These Care Teams include your primary Cardiologist (physician) and Advanced Practice Providers (APPs -  Physician Assistants and Nurse Practitioners) who all work together to provide you with the care you need, when you need it.  We recommend signing up for the patient portal called "MyChart".  Sign up information is provided on this After Visit Summary.  MyChart is used to connect with patients for Virtual Visits (Telemedicine).  Patients are able to view lab/test results, encounter notes, upcoming appointments, etc.  Non-urgent messages can be sent to your provider as well.   To learn more about what  you can do with MyChart, go to [email or web address].    Your next appointment:   6 month(s)  The format for your next appointment:   In Person  Provider:   You may see Verne Carrow, MD or one of the following Advanced Practice Providers on your designated Care Team:    Ronie Spies, PA-C  Jacolyn Reedy, PA-C    Other Instructions None

## 2021-06-05 ENCOUNTER — Other Ambulatory Visit: Payer: 59 | Admitting: *Deleted

## 2021-06-05 ENCOUNTER — Other Ambulatory Visit: Payer: Self-pay

## 2021-06-05 DIAGNOSIS — Z79899 Other long term (current) drug therapy: Secondary | ICD-10-CM

## 2021-06-05 LAB — LIPID PANEL
Chol/HDL Ratio: 2.7 ratio (ref 0.0–5.0)
Cholesterol, Total: 112 mg/dL (ref 100–199)
HDL: 41 mg/dL (ref >39)
LDL Chol Calc (NIH): 54 mg/dL (ref 0–99)
Triglycerides: 84 mg/dL (ref 0–149)
VLDL Cholesterol Cal: 17 mg/dL (ref 5–40)

## 2021-06-05 LAB — HEPATIC FUNCTION PANEL
ALT: 31 IU/L (ref 0–44)
AST: 27 IU/L (ref 0–40)
Albumin: 4.5 g/dL (ref 4.0–5.0)
Alkaline Phosphatase: 109 IU/L (ref 44–121)
Bilirubin Total: 0.9 mg/dL (ref 0.0–1.2)
Bilirubin, Direct: 0.25 mg/dL (ref 0.00–0.40)
Total Protein: 6.9 g/dL (ref 6.0–8.5)

## 2021-06-15 ENCOUNTER — Ambulatory Visit (INDEPENDENT_AMBULATORY_CARE_PROVIDER_SITE_OTHER)
Admission: RE | Admit: 2021-06-15 | Discharge: 2021-06-15 | Disposition: A | Discharge status: Home / Self Care | Payer: 59 | Source: Ambulatory Visit | Attending: Cardiovascular Disease | Admitting: Cardiovascular Disease

## 2021-06-15 ENCOUNTER — Other Ambulatory Visit: Payer: Self-pay

## 2021-06-15 DIAGNOSIS — I712 Thoracic aortic aneurysm, without rupture, unspecified: Secondary | ICD-10-CM

## 2021-06-15 MED ORDER — IOHEXOL 350 MG/ML SOLN
100.0000 mL | Freq: Once | INTRAVENOUS | Status: AC | PRN
  Administered 2021-06-15: 100 mL via INTRAVENOUS

## 2022-01-08 ENCOUNTER — Other Ambulatory Visit: Payer: Self-pay

## 2022-01-08 ENCOUNTER — Ambulatory Visit (HOSPITAL_COMMUNITY): Payer: 59 | Attending: Cardiovascular Disease

## 2022-01-08 DIAGNOSIS — Q2112 Patent foramen ovale: Secondary | ICD-10-CM

## 2022-01-08 DIAGNOSIS — I77810 Thoracic aortic ectasia: Secondary | ICD-10-CM

## 2022-01-08 LAB — ECHOCARDIOGRAM COMPLETE
Area-P 1/2: 2.6 cm2
S' Lateral: 3.3 cm

## 2022-01-13 ENCOUNTER — Telehealth: Payer: Self-pay | Admitting: *Deleted

## 2022-01-13 DIAGNOSIS — I77810 Thoracic aortic ectasia: Secondary | ICD-10-CM

## 2022-01-13 NOTE — Telephone Encounter (Signed)
Order placed for repeat CTA in July per Dr. Angelena Form.

## 2022-01-13 NOTE — Telephone Encounter (Signed)
-----   Message from Isaiah Serge, NP sent at 01/11/2022 12:21 PM EST ----- Caren Hazy will you schedule/arrange?   Thanks laura ----- Message ----- From: Burnell Blanks, MD Sent: 01/11/2022   6:31 AM EST To: Isaiah Serge, NP, Rodman Key, RN  Mickel Baas, I will plan to repeat his CTA this summer. Thanks, chris ----- Message ----- From: Isaiah Serge, NP Sent: 01/09/2022  10:17 AM EST To: Burnell Blanks, MD, #  Dr. Angelena Form  Aortic dilatation is slightly more dilated at 46 mm, up from 65,  he has follow up with you in May.  Any studies before then?  Otherwise repeat echo in 6 months? Cecilie Kicks, FNP-C

## 2022-01-15 NOTE — Telephone Encounter (Signed)
Message from Nada Boozer, NP: Please let pt know that echo was normal - normal pump action and valves were normal.  His aorta is more dilated slightly and Dr. Clifton James reviewed and would like CT aorta repeated.  Just to verify.  Thanks.    Left detailed message of echo results on home VM # per DPR.

## 2022-01-29 ENCOUNTER — Ambulatory Visit: Payer: 59 | Admitting: Cardiovascular Disease

## 2022-04-17 ENCOUNTER — Other Ambulatory Visit: Payer: Self-pay | Admitting: Cardiology

## 2022-04-20 NOTE — Progress Notes (Signed)
? ?Chief Complaint  ?Patient presents with  ? Follow-up  ?  PFO  ? ?History of Present Illness: 41 yo male with history of PFO s/p closure, prior CVA who is here today for cardiac follow up. He was seen in my office in November 2019 for evaluation of a cardiac murmur. Echo December 2019 with normal LV systolic function and no significant valve disease. He is known to have a thoracic aortic aneurysm, 4.2 cm by CTA chest in January of 2020, 2021 and 2022. He was seen in the ED October 2020 with stroke like symptoms. MRI showed a small acute infarction in the right corona radiata and small old infarctions in the left putamen and old infarction in the left cerebellum. Echo with bubble study suggested a small PFO. Dopplers were negative for DVT. He was started on an ASA and statin. Hypercoagulable workup negative. TEE 09/27/19 with functionally bicuspid aortic valve with moderate to large PFO. He underwent PFO closure 10/05/19. Echo 10/05/19 with normal LV function and no atrial level shunt. He was discharged on ASA and Plavix. [facility] with chest pain and dizziness. CTA chest was negative for PE or dissection and confirmed his known thoracic aortic aneurysm which was stable in size. Troponin was negative. Chest CTA July 2022 with 4.1 cm ascending aortic aneurysm. Echo February 2023 with LVEF=55-60%. Dilated ascending aorta at 4.6 cm.  ? ?He is here today for follow up. The patient denies any chest pain, dyspnea, palpitations, lower extremity edema, orthopnea, PND, dizziness, near syncope or syncope.  ? ?Primary Care Physician: Tamsen SniderFerrell, Kirstie B, NP [place](Tara Gunter, NP) ? ?Past Medical History:  ?Diagnosis Date  ? Cardiac murmur, unspecified   ? Gout   ? PFO (patent foramen ovale)   ? Stroke [facility](HCC)   ? Thoracic aortic aneurysm (HCC)   ? ?Past Surgical History:  ?No surgeries ? ?Current Outpatient Medications  ?Medication Sig Dispense Refill  ? allopurinol (ZYLOPRIM) 300 MG tablet Take 300 mg by mouth  daily.    ? Ascorbic Acid (VITAMIN C) 100 MG tablet Take 100 mg by mouth daily.    ? aspirin EC 81 MG tablet Take 1 tablet (81 mg total) by mouth daily. 90 tablet 3  ? cetirizine (ZYRTEC) 10 MG tablet Take 10 mg by mouth daily as needed.    ? Cholecalciferol 125 MCG (5000 UT) capsule Take 5,000 Units by mouth daily.    ? fluticasone (FLONASE) 50 MCG/ACT nasal spray Place 1 spray into both nostrils 2 (two) times daily.    ? Multiple Vitamins-Minerals (MULTIVITAMIN WITH MINERALS) tablet Take 1 tablet by mouth daily.    ? Omega-3 Fatty Acids (FISH OIL) 1000 MG CAPS Take 2,000 mg by mouth daily.    ? rosuvastatin (CRESTOR) 20 MG tablet TAKE 1 TABLET(20 MG) BY MOUTH DAILY 90 tablet 0  ? Zinc Sulfate (ZINC 15 PO) Take by mouth.    ? ?No current facility-administered medications for this visit.  ? ? ?Allergies  ?Allergen Reactions  ? Penicillins Other (See Comments)  ?  Did it involve swelling of the face/tongue/throat, SOB, or low BP? Unknown ?Did it involve sudden or severe rash/hives, skin peeling, or any reaction on the inside of your mouth or nose? Unknown ?Did you need to seek medical attention at a hospital or doctor's office? Unknown ?When did it last happen?      childhood ?If all above answers are "NO", may proceed with cephalosporin use. ?  ? ? ?Social History  ? ?  Socioeconomic History  ? Marital status: Married  ?  Spouse name: Not on file  ? Number of children: 1  ? Years of education: Not on file  ? Highest education level: Not on file  ?Occupational History  ? Occupation: manages a Chief Technology Officer  ?Tobacco Use  ? Smoking status: Never  ? Smokeless tobacco: Never  ?Substance and Sexual Activity  ? Alcohol use: Yes  ?  Alcohol/week: 2.0 standard drinks  ?  Types: 2 Cans of beer per week  ?  Comment: social  ? Drug use: Never  ? Sexual activity: Yes  ?Other Topics Concern  ? Not on file  ?Social History Narrative  ? Not on file  ? ?Social Determinants of Health  ? ?Financial Resource Strain: Not on  file  ?Food Insecurity: Not on file  ?Transportation Needs: Not on file  ?Physical Activity: Not on file  ?Stress: Not on file  ?Social Connections: Not on file  ?Intimate Partner Violence: Not on file  ? ? ?Family History  ?Problem Relation Age of Onset  ? Heart murmur Mother   ? Benign prostatic hyperplasia Father   ? Other Father   ?     knee replacement  ? CAD Maternal Grandfather   ?     CABG X4  ? Atrial fibrillation Maternal Grandfather   ?     Had pacemaker  ? ? ?Review of Systems:  As stated in the HPI and otherwise negative.  ? ?BP 122/80   Pulse (!) 46   Ht 6\' 1"  (1.854 m)   Wt 266 lb 3.2 oz (120.7 kg)   SpO2 97%   BMI 35.12 kg/m?  ? ?Physical Examination: ?General: Well developed, well nourished, NAD  ?HEENT: OP clear, mucus membranes moist  ?SKIN: warm, dry. No rashes. ?Neuro: No focal deficits  ?Musculoskeletal: Muscle strength 5/5 all ext  ?Psychiatric: Mood and affect normal  ?Neck: No JVD, no carotid bruits, no thyromegaly, no lymphadenopathy.  ?Lungs:Clear bilaterally, no wheezes, rhonci, crackles ?Cardiovascular: Regular rate and rhythm. No murmurs, gallops or rubs. ?Abdomen:Soft. Bowel sounds present. Non-tender.  ?Extremities: No lower extremity edema. Pulses are 2 + in the bilateral DP/PT. ? ?EKG:  EKG is  ordered today. ?The ekg ordered today demonstrates Sinus brady, rate 46 bpm ? ?Echo February 2023: ? 1. Left ventricular ejection fraction, by estimation, is 55 to 60%. The  ?left ventricle has normal function. The left ventricle has no regional  ?wall motion abnormalities. Left ventricular diastolic parameters are  ?consistent with Grade I diastolic  ?dysfunction (impaired relaxation). The average left ventricular global  ?longitudinal strain is -22.3 %. The global longitudinal strain is normal.  ? 2. Right ventricular systolic function is normal. The right ventricular  ?size is normal.  ? 3. The mitral valve is normal in structure. No evidence of mitral valve  ?regurgitation. No  evidence of mitral stenosis.  ? 4. The aortic valve is tricuspid. Aortic valve regurgitation is not  ?visualized. No aortic stenosis is present.  ? 5. Aortic dilatation noted. There is moderate dilatation of the ascending  ?aorta, measuring 46 mm.  ? 6. The inferior vena cava is normal in size with greater than 50%  ?respiratory variability, suggesting right atrial pressure of 3 mmHg.  ? ?Recent Labs: ?06/05/2021: ALT 31  ? ?Lipid Panel ?   ?Component Value Date/Time  ? CHOL 112 06/05/2021 0753  ? TRIG 84 06/05/2021 0753  ? HDL 41 06/05/2021 0753  ? CHOLHDL 2.7 06/05/2021 0753  ?  LDLCALC 54 06/05/2021 0753  ? ?  ?Wt Readings from Last 3 Encounters:  ?04/21/22 266 lb 3.2 oz (120.7 kg)  ?04/24/21 266 lb (120.7 kg)  ?10/08/20 265 lb 3.2 oz (120.3 kg)  ?  ? ?Other studies Reviewed: ?Additional studies/ records that were reviewed today include:  ?Review of the above records demonstrates:  ? ? ?Assessment and Plan:  ? ?1. Patent foramen ovale: He is s/p PFO closure October 2020. Continue ASA ? ?2. Functionally bicuspid aortic valve with thoracic aortic aneurysm: 4.1 cm thoracic aortic aneurysm stable by chest CTA July 2022. Repeat CTA July 2023.  ? ?Current medicines are reviewed at length with the patient today.  The patient does not have concerns regarding medicines. ? ?The following changes have been made:  no change ? ?Labs/ tests ordered today include:  ? ?Orders Placed This Encounter  ?Procedures  ? EKG 12-Lead  ? ?Disposition:   F/U with me in 12 months ? ?Signed, ?Verne Carrow, MD ?04/21/2022 11:23 AM    ?[facility] ?[address] [place], [place], [place]  [address] ?Phone: [phone]; Fax: [phone]  ?

## 2022-04-21 ENCOUNTER — Ambulatory Visit (INDEPENDENT_AMBULATORY_CARE_PROVIDER_SITE_OTHER): Payer: 59 | Admitting: Cardiovascular Disease

## 2022-04-21 ENCOUNTER — Encounter: Payer: Self-pay | Admitting: Cardiovascular Disease

## 2022-04-21 VITALS — BP 122/80 | HR 46 | Ht 73.0 in | Wt 266.2 lb

## 2022-04-21 DIAGNOSIS — I77810 Thoracic aortic ectasia: Secondary | ICD-10-CM | POA: Diagnosis not present

## 2022-04-21 DIAGNOSIS — Q2112 Patent foramen ovale: Secondary | ICD-10-CM

## 2022-04-21 NOTE — Patient Instructions (Signed)
Medication Instructions:  ?No changes ?*If you need a refill on your cardiac medications before your next appointment, please call your pharmacy* ? ? ?Lab Work: ?none ? ? ?Testing/Procedures: ?CT Aorta - as scheduled. ? ? ?Follow-Up: ?At [facility], you and your health needs are our priority.  As part of our continuing mission to provide you with exceptional heart care, we have created designated Provider Care Teams.  These Care Teams include your primary Cardiologist (physician) and Advanced Practice Providers (APPs -  Physician Assistants and Nurse Practitioners) who all work together to provide you with the care you need, when you need it. ?  ? ?Your next appointment:   ?12 month(s) ? ?The format for your next appointment:   ?In Person ? ?Provider:   ?Verne Carrow, MD   ? ? ?Important Information About Sugar ? ? ? ? ?  ?

## 2022-06-07 ENCOUNTER — Encounter (HOSPITAL_COMMUNITY): Payer: Self-pay

## 2022-06-07 ENCOUNTER — Inpatient Hospital Stay: Admission: RE | Admit: 2022-06-07 | Payer: 59 | Source: Ambulatory Visit

## 2022-06-07 ENCOUNTER — Ambulatory Visit (HOSPITAL_COMMUNITY)
Admission: RE | Admit: 2022-06-07 | Discharge: 2022-06-07 | Disposition: A | Discharge status: Home / Self Care | Payer: 59 | Source: Ambulatory Visit | Attending: Cardiovascular Disease | Admitting: Cardiovascular Disease

## 2022-06-07 DIAGNOSIS — I77810 Thoracic aortic ectasia: Secondary | ICD-10-CM | POA: Diagnosis present

## 2022-06-07 MED ORDER — SODIUM CHLORIDE (PF) 0.9 % IJ SOLN
INTRAMUSCULAR | Status: AC
  Filled 2022-06-07: qty 50

## 2022-06-07 MED ORDER — IOHEXOL 350 MG/ML SOLN
100.0000 mL | Freq: Once | INTRAVENOUS | Status: AC | PRN
  Administered 2022-06-07: 100 mL via INTRAVENOUS

## 2022-06-16 ENCOUNTER — Other Ambulatory Visit: Payer: Self-pay | Admitting: *Deleted

## 2022-06-16 DIAGNOSIS — I77810 Thoracic aortic ectasia: Secondary | ICD-10-CM

## 2022-06-16 NOTE — Progress Notes (Signed)
Order placed for ct chest aorta for dilated aortic root, due July 2024.

## 2022-07-01 ENCOUNTER — Telehealth: Payer: Self-pay

## 2022-07-01 ENCOUNTER — Other Ambulatory Visit: Payer: Self-pay | Admitting: Cardiovascular Disease

## 2022-07-01 ENCOUNTER — Encounter: Payer: Self-pay | Admitting: Cardiovascular Disease

## 2022-07-01 NOTE — Telephone Encounter (Signed)
Called patient as requested in MyChart message sent today.  Patient states he is out of his Rosuvastatin, his last bottle reads "no refills." A refill requested was sent to [facility] today with receipt confirmed at 12:28PM. Patient states he received a text message from pharmacy at 12:37PM that it is too early to refill.  It appears Rx was last filled for 90 days in May 2023. Patient states he's not sure if he has the Rx in his home. Asked if patient could look again, prescription may be in a different kind of bottle than he is used to.  Patient found full bottle of Rosuvastatin 20mg  in cabinet, he states it was in a small white bottle, different than what he is used to.  No further assistance needed, patient expressed appreciation for call.

## 2023-01-05 ENCOUNTER — Encounter: Payer: Self-pay | Admitting: Cardiovascular Disease

## 2023-01-13 ENCOUNTER — Encounter: Payer: Self-pay | Admitting: Cardiovascular Disease
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
## 2023-04-19 ENCOUNTER — Encounter: Payer: Self-pay | Admitting: Physician Assistant

## 2023-04-19 ENCOUNTER — Ambulatory Visit: Payer: 59 | Attending: Physician Assistant | Admitting: Physician Assistant

## 2023-04-19 VITALS — BP 118/74 | HR 48 | Ht 73.0 in | Wt 269.0 lb

## 2023-04-19 DIAGNOSIS — E669 Obesity, unspecified: Secondary | ICD-10-CM

## 2023-04-19 DIAGNOSIS — E7849 Other hyperlipidemia: Secondary | ICD-10-CM | POA: Diagnosis not present

## 2023-04-19 DIAGNOSIS — Q2112 Patent foramen ovale: Secondary | ICD-10-CM | POA: Diagnosis not present

## 2023-04-19 DIAGNOSIS — R002 Palpitations: Secondary | ICD-10-CM | POA: Diagnosis not present

## 2023-04-19 DIAGNOSIS — I712 Thoracic aortic aneurysm, without rupture, unspecified: Secondary | ICD-10-CM

## 2023-04-19 NOTE — Patient Instructions (Signed)
Medication Instructions:  Your physician recommends that you continue on your current medications as directed. Please refer to the Current Medication list given to you today.  *If you need a refill on your cardiac medications before your next appointment, please call your pharmacy*   Lab Work: Bmp to be drawn prior to chest Ct  If you have labs (blood work) drawn today and your tests are completely normal, you will receive your results only by: MyChart Message (if you have MyChart) OR A paper copy in the mail If you have any lab test that is abnormal or we need to change your treatment, we will call you to review the results.   Testing/Procedures: Your are scheduled for a chest ct of the aorta in July    Follow-Up: At [facility], you and your health needs are our priority.  As part of our continuing mission to provide you with exceptional heart care, we have created designated Provider Care Teams.  These Care Teams include your primary Cardiologist (physician) and Advanced Practice Providers (APPs -  Physician Assistants and Nurse Practitioners) who all work together to provide you with the care you need, when you need it.  We recommend signing up for the patient portal called "MyChart".  Sign up information is provided on this After Visit Summary.  MyChart is used to connect with patients for Virtual Visits (Telemedicine).  Patients are able to view lab/test results, encounter notes, upcoming appointments, etc.  Non-urgent messages can be sent to your provider as well.   To learn more about what you can do with MyChart, go to [email or web address].    Your next appointment:    12 month(s)  Provider:   Verne Carrow, MD    Other Instructions

## 2023-06-17 ENCOUNTER — Ambulatory Visit: Payer: 59 | Attending: Physician Assistant

## 2023-06-17 DIAGNOSIS — I712 Thoracic aortic aneurysm, without rupture, unspecified: Secondary | ICD-10-CM

## 2023-06-17 LAB — BASIC METABOLIC PANEL
BUN/Creatinine Ratio: 20 (ref 9–20)
BUN: 17 mg/dL (ref 6–24)
CO2: 24 mmol/L (ref 20–29)
Calcium: 9.6 mg/dL (ref 8.7–10.2)
Chloride: 103 mmol/L (ref 96–106)
Creatinine, Ser: 0.83 mg/dL (ref 0.76–1.27)
Glucose: 107 mg/dL — ABNORMAL HIGH (ref 70–99)
Potassium: 4.2 mmol/L (ref 3.5–5.2)
Sodium: 141 mmol/L (ref 134–144)
eGFR: 111 mL/min/{1.73_m2} (ref >59)

## 2023-06-22 ENCOUNTER — Ambulatory Visit (HOSPITAL_COMMUNITY)
Admission: RE | Admit: 2023-06-22 | Discharge: 2023-06-22 | Disposition: A | Discharge status: Home / Self Care | Payer: 59 | Source: Ambulatory Visit | Attending: Cardiovascular Disease | Admitting: Cardiovascular Disease

## 2023-06-22 ENCOUNTER — Encounter (HOSPITAL_COMMUNITY): Payer: Self-pay

## 2023-06-22 DIAGNOSIS — I77810 Thoracic aortic ectasia: Secondary | ICD-10-CM | POA: Insufficient documentation

## 2023-06-22 MED ORDER — SODIUM CHLORIDE (PF) 0.9 % IJ SOLN
INTRAMUSCULAR | Status: AC
  Filled 2023-06-22: qty 50

## 2023-06-22 MED ORDER — IOHEXOL 350 MG/ML SOLN
100.0000 mL | Freq: Once | INTRAVENOUS | Status: AC | PRN
  Administered 2023-06-22: 100 mL via INTRAVENOUS

## 2023-06-28 ENCOUNTER — Other Ambulatory Visit: Payer: Self-pay | Admitting: *Deleted

## 2023-06-28 DIAGNOSIS — I712 Thoracic aortic aneurysm, without rupture, unspecified: Secondary | ICD-10-CM

## 2023-06-28 NOTE — Progress Notes (Signed)
Repeat chest CTA in one year.  Order placed.

## 2023-09-06 ENCOUNTER — Encounter: Payer: Self-pay | Admitting: Cardiovascular Disease

## 2023-09-06 MED ORDER — ROSUVASTATIN CALCIUM 20 MG PO TABS: 20.0000 mg | ORAL_TABLET | Freq: Every day | ORAL | 2 refills | Status: DC

## 2024-03-07 ENCOUNTER — Encounter: Payer: Self-pay | Admitting: Cardiovascular Disease

## 2024-04-23 ENCOUNTER — Ambulatory Visit: Attending: Cardiovascular Disease | Admitting: Cardiovascular Disease

## 2024-04-23 ENCOUNTER — Encounter: Payer: Self-pay | Admitting: Cardiovascular Disease

## 2024-04-23 VITALS — BP 128/82 | HR 55 | Ht 73.0 in | Wt 270.0 lb

## 2024-04-23 DIAGNOSIS — I712 Thoracic aortic aneurysm, without rupture, unspecified: Secondary | ICD-10-CM

## 2024-04-23 DIAGNOSIS — Q2112 Patent foramen ovale: Secondary | ICD-10-CM

## 2024-04-23 NOTE — Progress Notes (Signed)
 Chief Complaint  Patient presents with   Follow-up    PFO, thoracic aortic aneurysm   History of Present Illness: 43 yo male with history of PFO s/p closure, prior CVA, thoracic aortic aneurysm, probable bicuspid aortic valve who is here today for cardiac follow up. He was seen in my office in November 2019 for evaluation of a cardiac murmur. Echo December 2019 with normal LV systolic function and no significant valve disease. He is known to have a thoracic aortic aneurysm, 4.2 cm by CTA chest in January of 2020, 2021 and 2022. He was seen in the ED October 2020 with stroke like symptoms. MRI showed a small acute infarction in the right corona radiata and small old infarctions in the left putamen and old infarction in the left cerebellum. Echo with bubble study suggested a small PFO. Dopplers were negative for DVT. He was started on an ASA and statin. Hypercoagulable workup negative. TEE 09/27/19 with functionally bicuspid aortic valve with moderate to large PFO. He underwent PFO closure 10/05/19. Echo 10/05/19 with normal LV function and no atrial level shunt. He was discharged on ASA and Plavix . [facility] was [facility] to [facility]/26/20 with chest pain and dizziness. CTA chest was negative for PE or dissection and confirmed his known thoracic aortic aneurysm which was stable in size. Troponin was negative. Chest CTA July 2024 with 4.4 cm ascending aortic aneurysm. Echo February 2023 with LVEF=55-60%. Probable bicuspid AV.   He is here today for follow up. The patient denies any chest pain, dyspnea, palpitations, lower extremity edema, orthopnea, PND, dizziness, near syncope or syncope.   Primary Care Physician: Jenean Minus, NP Ruben Corolla, NP)  Past Medical History:  Diagnosis Date   Cardiac murmur, unspecified    Gout    PFO (patent foramen ovale)    Stroke [facility])    Thoracic aortic aneurysm [facility])    Past Surgical History:  No surgeries  Current Outpatient Medications  Medication Sig  Dispense Refill   allopurinol  (ZYLOPRIM ) 300 MG tablet Take 300 mg by mouth daily.     Ascorbic Acid (VITAMIN C) 100 MG tablet Take 100 mg by mouth daily.     aspirin  EC 81 MG tablet Take 1 tablet (81 mg total) by mouth daily. 90 tablet 3   cetirizine (ZYRTEC) 10 MG tablet Take 10 mg by mouth daily as needed.     Cholecalciferol 125 MCG (5000 UT) capsule Take 5,000 Units by mouth daily.     fluticasone (FLONASE) 50 MCG/ACT nasal spray Place 1 spray into both nostrils 2 (two) times daily.     Multiple Vitamins-Minerals (MULTIVITAMIN WITH MINERALS) tablet Take 1 tablet by mouth daily.     Omega-3 Fatty Acids (FISH OIL) 1000 MG CAPS Take 2,000 mg by mouth daily.     rosuvastatin  (CRESTOR ) 20 MG tablet Take 1 tablet (20 mg total) by mouth daily. 90 tablet 2   Zinc Sulfate (ZINC 15 PO) Take by mouth.     No current facility-administered medications for this visit.    Allergies  Allergen Reactions   Penicillins Other (See Comments)    Did it involve swelling of the face/tongue/throat, SOB, or low BP? Unknown Did it involve sudden or severe rash/hives, skin peeling, or any reaction on the inside of your mouth or nose? Unknown Did you need to seek medical attention at a hospital or doctor's office? Unknown When did it last happen?      childhood If all above answers are "NO", may proceed  with cephalosporin use.     Social History   Socioeconomic History   Marital status: Married    Spouse name: Not on file   Number of children: 1   Years of education: Not on file   Highest education level: Not on file  Occupational History   Occupation: manages a [employer]  Tobacco Use   Smoking status: Never   Smokeless tobacco: Never  Substance and Sexual Activity   Alcohol use: Yes    Alcohol/week: 2.0 standard drinks of alcohol    Types: 2 Cans of beer per week    Comment: social   Drug use: Never   Sexual activity: Yes  Other Topics Concern   Not on file  Social History  Narrative   Not on file   Social Drivers of Health   Financial Resource Strain: Not on file  Food Insecurity: Not on file  Transportation Needs: Not on file  Physical Activity: Not on file  Stress: Not on file  Social Connections: Unknown (04/15/2022)   Received from [facility], [facility]   Social Network    Social Network: Not on file  Intimate Partner Violence: Unknown (03/12/2022)   Received from [facility], [facility]   HITS    Physically Hurt: Not on file    Insult or Talk Down To: Not on file    Threaten Physical Harm: Not on file    Scream or Curse: Not on file    Family History  Problem Relation Age of Onset   Heart murmur Mother    Benign prostatic hyperplasia Father    Other Father        knee replacement   CAD Maternal Grandfather        CABG X4   Atrial fibrillation Maternal Grandfather        Had pacemaker   Review of Systems:  As stated in the HPI and otherwise negative.   BP 128/82   Pulse (!) 55   Ht 6\' 1"  (1.854 m)   Wt 270 lb (122.5 kg)   SpO2 98%   BMI 35.62 kg/m   Physical Examination: General: Well developed, well nourished, NAD  HEENT: OP clear, mucus membranes moist  SKIN: warm, dry. No rashes. Neuro: No focal deficits  Musculoskeletal: Muscle strength 5/5 all ext  Psychiatric: Mood and affect normal  Neck: No JVD, no carotid bruits, no thyromegaly, no lymphadenopathy.  Lungs:Clear bilaterally, no wheezes, rhonci, crackles Cardiovascular: Regular rate and rhythm. Soft systolic murmur.  Abdomen:Soft. Bowel sounds present. Non-tender.  Extremities: No lower extremity edema. Pulses are 2 + in the bilateral DP/PT.  EKG:  EKG is ordered today. The ekg ordered today demonstrates  EKG Interpretation Date/Time:  Monday Apr 23 2024 10:46:14 EDT Ventricular Rate:  52 PR Interval:  192 QRS Duration:  90 QT Interval:  430 QTC Calculation: 399 R Axis:   -53  Text Interpretation: Sinus bradycardia Left anterior fascicular block  Confirmed by Antoinette Batman [phone]) on 04/23/2024 10:53:32 AM    Recent Labs: 06/17/2023: BUN 17; Creatinine, Ser 0.83; Potassium 4.2; Sodium 141   Lipid Panel    Component Value Date/Time   CHOL 112 06/05/2021 0753   TRIG 84 06/05/2021 0753   HDL 41 06/05/2021 0753   CHOLHDL 2.7 06/05/2021 0753   LDLCALC 54 06/05/2021 0753     Wt Readings from Last 3 Encounters:  04/23/24 270 lb (122.5 kg)  04/19/23 269 lb (122 kg)  04/21/22 266 lb 3.2 oz (120.7 kg)  Assessment and Plan:   1. Patent foramen ovale: He is s/p PFO closure October 2020. Continue ASA  2. Functionally bicuspid aortic valve with thoracic aortic aneurysm: 4.4 cm thoracic aortic aneurysm stable by chest CTA July 2024. Will arrange chest MRA in July 2025 to limit radiation exposure since he is getting yearly scans.   Labs/ tests ordered today include:  Orders Placed This Encounter  Procedures   MR Angiogram Chest W Wo Contrast   EKG 12-Lead   Disposition:   F/U with me in 12 months  Signed, Antoinette Batman, MD 04/23/2024 11:30 AM    [facility] [address] [place], [place], [place]  [address] Phone: [phone]; Fax: [phone]

## 2024-04-23 NOTE — Patient Instructions (Signed)
 Medication Instructions:  No changes *If you need a refill on your cardiac medications before your next appointment, please call your pharmacy*  Lab Work: none If you have labs (blood work) drawn today and your tests are completely normal, you will receive your results only by: MyChart Message (if you have MyChart) OR A paper copy in the mail If you have any lab test that is abnormal or we need to change your treatment, we will call you to review the results.  Testing/Procedures: MR Angiogram Aorta - Due in July 2025  Follow-Up: At [facility], you and your health needs are our priority.  As part of our continuing mission to provide you with exceptional heart care, our providers are all part of one team.  This team includes your primary Cardiologist (physician) and Advanced Practice Providers or APPs (Physician Assistants and Nurse Practitioners) who all work together to provide you with the care you need, when you need it.  Your next appointment:   12 month(s)  Provider:   Antoinette Batman, MD     Other Instructions

## 2024-04-24 ENCOUNTER — Encounter: Payer: Self-pay | Admitting: Cardiovascular Disease

## 2024-06-11 ENCOUNTER — Other Ambulatory Visit: Payer: Self-pay | Admitting: Cardiovascular Disease

## 2024-06-21 ENCOUNTER — Encounter: Payer: Self-pay | Admitting: Cardiovascular Disease

## 2024-06-25 ENCOUNTER — Ambulatory Visit (HOSPITAL_COMMUNITY)
Admission: RE | Admit: 2024-06-25 | Discharge: 2024-06-25 | Disposition: A | Discharge status: Home / Self Care | Source: Ambulatory Visit | Attending: Cardiovascular Disease | Admitting: Cardiovascular Disease

## 2024-06-25 DIAGNOSIS — I712 Thoracic aortic aneurysm, without rupture, unspecified: Secondary | ICD-10-CM | POA: Insufficient documentation

## 2024-06-25 MED ORDER — GADOBUTROL 1 MMOL/ML IV SOLN
10.0000 mL | Freq: Once | INTRAVENOUS | Status: AC | PRN
  Administered 2024-06-25: 10 mL via INTRAVENOUS

## 2024-07-02 ENCOUNTER — Ambulatory Visit: Payer: Self-pay | Admitting: Cardiovascular Disease

## 2024-07-02 DIAGNOSIS — I712 Thoracic aortic aneurysm, without rupture, unspecified: Secondary | ICD-10-CM
